# Patient Record
Sex: Female | Born: 1935 | Race: White | Hispanic: No | Marital: Married | Smoking: Never smoker
Health system: Southern US, Community
[De-identification: ages and names within clinical notes are randomized; demographics above are authoritative.]

## PROBLEM LIST (undated history)

## (undated) DIAGNOSIS — E785 Hyperlipidemia, unspecified: Secondary | ICD-10-CM

## (undated) DIAGNOSIS — N390 Urinary tract infection, site not specified: Secondary | ICD-10-CM

## (undated) DIAGNOSIS — R32 Unspecified urinary incontinence: Secondary | ICD-10-CM

## (undated) DIAGNOSIS — F32A Depression, unspecified: Secondary | ICD-10-CM

## (undated) DIAGNOSIS — F329 Major depressive disorder, single episode, unspecified: Secondary | ICD-10-CM

## (undated) DIAGNOSIS — I5022 Chronic systolic (congestive) heart failure: Secondary | ICD-10-CM

## (undated) DIAGNOSIS — I10 Essential (primary) hypertension: Secondary | ICD-10-CM

## (undated) DIAGNOSIS — F03B0 Unspecified dementia, moderate, without behavioral disturbance, psychotic disturbance, mood disturbance, and anxiety: Secondary | ICD-10-CM

## (undated) DIAGNOSIS — I255 Ischemic cardiomyopathy: Secondary | ICD-10-CM

## (undated) DIAGNOSIS — I2510 Atherosclerotic heart disease of native coronary artery without angina pectoris: Secondary | ICD-10-CM

## (undated) DIAGNOSIS — I350 Nonrheumatic aortic (valve) stenosis: Secondary | ICD-10-CM

## (undated) DIAGNOSIS — I471 Supraventricular tachycardia, unspecified: Secondary | ICD-10-CM

## (undated) DIAGNOSIS — Z9289 Personal history of other medical treatment: Secondary | ICD-10-CM

## (undated) DIAGNOSIS — F0390 Unspecified dementia without behavioral disturbance: Secondary | ICD-10-CM

## (undated) DIAGNOSIS — A809 Acute poliomyelitis, unspecified: Secondary | ICD-10-CM

## (undated) DIAGNOSIS — K5641 Fecal impaction: Secondary | ICD-10-CM

## (undated) DIAGNOSIS — R011 Cardiac murmur, unspecified: Secondary | ICD-10-CM

## (undated) DIAGNOSIS — M1990 Unspecified osteoarthritis, unspecified site: Secondary | ICD-10-CM

## (undated) HISTORY — PX: CHOLECYSTECTOMY: SHX55

## (undated) HISTORY — PX: TONSILLECTOMY: SUR1361

## (undated) HISTORY — PX: BUNIONECTOMY: SHX129

## (undated) HISTORY — PX: CATARACT EXTRACTION W/ INTRAOCULAR LENS  IMPLANT, BILATERAL: SHX1307

## (undated) HISTORY — PX: GANGLION CYST EXCISION: SHX1691

## (undated) HISTORY — PX: ANKLE ARTHROPLASTY: SUR68

## (undated) HISTORY — PX: ABDOMINAL HYSTERECTOMY: SHX81

## (undated) HISTORY — PX: CARDIAC CATHETERIZATION: SHX172

## (undated) HISTORY — DX: Ischemic cardiomyopathy: I25.5

## (undated) HISTORY — PX: TOTAL KNEE ARTHROPLASTY: SHX125

## (undated) HISTORY — PX: CARPAL TUNNEL RELEASE: SHX101

## (undated) HISTORY — DX: Chronic systolic (congestive) heart failure: I50.22

## (undated) HISTORY — PX: ATRIAL TACH ABLATION: SHX5734

## (undated) HISTORY — PX: CORONARY ANGIOPLASTY: SHX604

---

## 1996-11-06 DIAGNOSIS — Z9289 Personal history of other medical treatment: Secondary | ICD-10-CM

## 1996-11-06 HISTORY — DX: Personal history of other medical treatment: Z92.89

## 1997-04-02 DIAGNOSIS — Z951 Presence of aortocoronary bypass graft: Secondary | ICD-10-CM

## 1997-04-02 HISTORY — PX: CORONARY ARTERY BYPASS GRAFT: SHX141

## 2004-09-09 ENCOUNTER — Ambulatory Visit: Payer: Self-pay | Admitting: Urology

## 2004-12-23 ENCOUNTER — Ambulatory Visit: Payer: Self-pay | Admitting: Internal Medicine

## 2005-10-03 ENCOUNTER — Ambulatory Visit: Payer: Self-pay | Admitting: Internal Medicine

## 2006-01-03 ENCOUNTER — Ambulatory Visit: Payer: Self-pay | Admitting: Internal Medicine

## 2006-02-28 ENCOUNTER — Encounter: Admission: RE | Admit: 2006-02-28 | Discharge: 2006-02-28 | Payer: Self-pay | Admitting: Neurosurgery

## 2006-04-06 ENCOUNTER — Inpatient Hospital Stay (HOSPITAL_COMMUNITY): Admission: RE | Admit: 2006-04-06 | Discharge: 2006-04-07 | Payer: Self-pay | Admitting: Neurosurgery

## 2006-08-23 ENCOUNTER — Other Ambulatory Visit: Payer: Self-pay

## 2006-08-23 ENCOUNTER — Ambulatory Visit: Payer: Self-pay | Admitting: Unknown Physician Specialty

## 2006-08-27 ENCOUNTER — Ambulatory Visit: Payer: Self-pay | Admitting: Unknown Physician Specialty

## 2007-01-07 ENCOUNTER — Ambulatory Visit: Payer: Self-pay | Admitting: Internal Medicine

## 2007-03-15 ENCOUNTER — Ambulatory Visit: Payer: Self-pay | Admitting: Unknown Physician Specialty

## 2007-03-15 ENCOUNTER — Other Ambulatory Visit: Payer: Self-pay

## 2007-03-25 ENCOUNTER — Inpatient Hospital Stay: Payer: Self-pay | Admitting: Unknown Physician Specialty

## 2007-07-10 ENCOUNTER — Ambulatory Visit: Payer: Self-pay | Admitting: Gastroenterology

## 2008-01-09 ENCOUNTER — Ambulatory Visit: Payer: Self-pay | Admitting: Internal Medicine

## 2009-01-11 ENCOUNTER — Ambulatory Visit: Payer: Self-pay | Admitting: Internal Medicine

## 2009-01-14 ENCOUNTER — Ambulatory Visit: Payer: Self-pay | Admitting: Urology

## 2009-01-19 ENCOUNTER — Ambulatory Visit: Payer: Self-pay | Admitting: Urology

## 2010-01-13 ENCOUNTER — Ambulatory Visit: Payer: Self-pay | Admitting: Internal Medicine

## 2010-01-18 ENCOUNTER — Ambulatory Visit: Payer: Self-pay | Admitting: Internal Medicine

## 2010-07-12 ENCOUNTER — Ambulatory Visit: Payer: Self-pay | Admitting: Gastroenterology

## 2010-07-26 ENCOUNTER — Ambulatory Visit: Payer: Self-pay | Admitting: Surgery

## 2011-01-18 ENCOUNTER — Ambulatory Visit: Payer: Self-pay | Admitting: Internal Medicine

## 2011-09-05 ENCOUNTER — Ambulatory Visit: Payer: Self-pay | Admitting: Ophthalmology

## 2011-09-05 DIAGNOSIS — I119 Hypertensive heart disease without heart failure: Secondary | ICD-10-CM

## 2011-09-12 ENCOUNTER — Ambulatory Visit: Payer: Self-pay | Admitting: Ophthalmology

## 2012-01-22 ENCOUNTER — Ambulatory Visit: Payer: Self-pay | Admitting: Internal Medicine

## 2012-02-07 ENCOUNTER — Emergency Department: Payer: Self-pay

## 2012-06-11 ENCOUNTER — Ambulatory Visit: Payer: Self-pay | Admitting: Internal Medicine

## 2012-11-24 ENCOUNTER — Inpatient Hospital Stay: Payer: Self-pay | Admitting: Internal Medicine

## 2012-11-24 LAB — URINALYSIS, COMPLETE
Bilirubin,UR: NEGATIVE
Glucose,UR: NEGATIVE mg/dL (ref 0–75)
RBC,UR: 6 /HPF (ref 0–5)
Specific Gravity: 1.014 (ref 1.003–1.030)
Squamous Epithelial: NONE SEEN

## 2012-11-24 LAB — COMPREHENSIVE METABOLIC PANEL
Alkaline Phosphatase: 135 U/L (ref 50–136)
Anion Gap: 7 (ref 7–16)
Calcium, Total: 9.6 mg/dL (ref 8.5–10.1)
Chloride: 103 mmol/L (ref 98–107)
Co2: 28 mmol/L (ref 21–32)
EGFR (African American): >60
Glucose: 105 mg/dL — ABNORMAL HIGH (ref 65–99)
Osmolality: 277 (ref 275–301)
Potassium: 3.4 mmol/L — ABNORMAL LOW (ref 3.5–5.1)
Sodium: 138 mmol/L (ref 136–145)
Total Protein: 7.8 g/dL (ref 6.4–8.2)

## 2012-11-24 LAB — TROPONIN I: Troponin-I: 0.03 ng/mL

## 2012-11-24 LAB — CBC
HCT: 43.9 % (ref 35.0–47.0)
HGB: 15.1 g/dL (ref 12.0–16.0)
MCH: 30.1 pg (ref 26.0–34.0)
MCHC: 34.5 g/dL (ref 32.0–36.0)
Platelet: 136 10*3/uL — ABNORMAL LOW (ref 150–440)
RBC: 5.02 10*6/uL (ref 3.80–5.20)

## 2012-11-25 LAB — BASIC METABOLIC PANEL
BUN: 15 mg/dL (ref 7–18)
Calcium, Total: 9.5 mg/dL (ref 8.5–10.1)
Chloride: 102 mmol/L (ref 98–107)
Co2: 28 mmol/L (ref 21–32)
Creatinine: 0.59 mg/dL — ABNORMAL LOW (ref 0.60–1.30)
EGFR (African American): >60
Glucose: 92 mg/dL (ref 65–99)
Osmolality: 274 (ref 275–301)

## 2012-11-25 LAB — CBC WITH DIFFERENTIAL/PLATELET
Basophil #: 0.1 10*3/uL (ref 0.0–0.1)
Basophil %: 0.4 %
HCT: 43.4 % (ref 35.0–47.0)
HGB: 14.5 g/dL (ref 12.0–16.0)
Lymphocyte %: 6.5 %
MCH: 29.5 pg (ref 26.0–34.0)
MCHC: 33.5 g/dL (ref 32.0–36.0)
Monocyte #: 1.2 x10 3/mm — ABNORMAL HIGH (ref 0.2–0.9)
Neutrophil %: 82.9 %
Platelet: 132 10*3/uL — ABNORMAL LOW (ref 150–440)
RBC: 4.92 10*6/uL (ref 3.80–5.20)
RDW: 14 % (ref 11.5–14.5)

## 2012-11-25 LAB — LIPID PANEL
Cholesterol: 143 mg/dL (ref 0–200)
Triglycerides: 84 mg/dL (ref 0–200)
VLDL Cholesterol, Calc: 17 mg/dL (ref 5–40)

## 2012-11-25 LAB — MAGNESIUM: Magnesium: 1.8 mg/dL

## 2012-11-30 LAB — CULTURE, BLOOD (SINGLE)

## 2013-07-01 ENCOUNTER — Emergency Department: Payer: Self-pay | Admitting: Emergency Medicine

## 2013-07-01 LAB — CBC WITH DIFFERENTIAL/PLATELET
Eosinophil %: 0.4 %
HCT: 45.7 % (ref 35.0–47.0)
HGB: 15.8 g/dL (ref 12.0–16.0)
MCHC: 34.7 g/dL (ref 32.0–36.0)
Monocyte #: 0.5 x10 3/mm (ref 0.2–0.9)
Monocyte %: 3.2 %
Neutrophil #: 14.6 10*3/uL — ABNORMAL HIGH (ref 1.4–6.5)
Neutrophil %: 93.3 %
Platelet: 176 10*3/uL (ref 150–440)
RBC: 5.18 10*6/uL (ref 3.80–5.20)
RDW: 13.4 % (ref 11.5–14.5)
WBC: 15.6 10*3/uL — ABNORMAL HIGH (ref 3.6–11.0)

## 2013-07-01 LAB — URINALYSIS, COMPLETE
Bilirubin,UR: NEGATIVE
Blood: NEGATIVE
Glucose,UR: NEGATIVE mg/dL (ref 0–75)
Ketone: NEGATIVE
Leukocyte Esterase: NEGATIVE
Ph: 5 (ref 4.5–8.0)
Protein: 30
RBC,UR: <1 /HPF (ref 0–5)
Specific Gravity: 1.017 (ref 1.003–1.030)
Squamous Epithelial: 1

## 2013-07-01 LAB — COMPREHENSIVE METABOLIC PANEL
Albumin: 3.9 g/dL (ref 3.4–5.0)
Anion Gap: 9 (ref 7–16)
Bilirubin,Total: 1.2 mg/dL — ABNORMAL HIGH (ref 0.2–1.0)
Calcium, Total: 10.1 mg/dL (ref 8.5–10.1)
Chloride: 101 mmol/L (ref 98–107)
Creatinine: 0.7 mg/dL (ref 0.60–1.30)
EGFR (African American): >60
EGFR (Non-African Amer.): >60
Glucose: 108 mg/dL — ABNORMAL HIGH (ref 65–99)
Potassium: 3.4 mmol/L — ABNORMAL LOW (ref 3.5–5.1)
SGOT(AST): 411 U/L — ABNORMAL HIGH (ref 15–37)
SGPT (ALT): 298 U/L — ABNORMAL HIGH (ref 12–78)
Sodium: 136 mmol/L (ref 136–145)

## 2013-09-12 ENCOUNTER — Emergency Department: Payer: Self-pay | Admitting: Emergency Medicine

## 2013-10-09 ENCOUNTER — Ambulatory Visit: Payer: Self-pay | Admitting: Ophthalmology

## 2013-10-20 ENCOUNTER — Ambulatory Visit: Payer: Self-pay | Admitting: Ophthalmology

## 2013-12-07 HISTORY — PX: CARDIAC CATHETERIZATION: SHX172

## 2013-12-24 DIAGNOSIS — K5641 Fecal impaction: Secondary | ICD-10-CM

## 2013-12-24 DIAGNOSIS — N390 Urinary tract infection, site not specified: Secondary | ICD-10-CM

## 2013-12-24 HISTORY — DX: Urinary tract infection, site not specified: N39.0

## 2013-12-24 HISTORY — DX: Fecal impaction: K56.41

## 2013-12-24 LAB — URINALYSIS, COMPLETE
Bilirubin,UR: NEGATIVE
GLUCOSE, UR: NEGATIVE mg/dL (ref 0–75)
Ketone: NEGATIVE
NITRITE: NEGATIVE
PH: 6 (ref 4.5–8.0)
Protein: NEGATIVE
RBC,UR: 2 /HPF (ref 0–5)
SPECIFIC GRAVITY: 1.004 (ref 1.003–1.030)
Squamous Epithelial: 24
WBC UR: 25 /HPF (ref 0–5)

## 2013-12-24 LAB — COMPREHENSIVE METABOLIC PANEL
ALBUMIN: 3.6 g/dL (ref 3.4–5.0)
ALT: 16 U/L (ref 12–78)
AST: 21 U/L (ref 15–37)
Alkaline Phosphatase: 90 U/L
Anion Gap: 6 — ABNORMAL LOW (ref 7–16)
BUN: 17 mg/dL (ref 7–18)
Bilirubin,Total: 1.1 mg/dL — ABNORMAL HIGH (ref 0.2–1.0)
CO2: 29 mmol/L (ref 21–32)
CREATININE: 0.73 mg/dL (ref 0.60–1.30)
Calcium, Total: 10.2 mg/dL — ABNORMAL HIGH (ref 8.5–10.1)
Chloride: 104 mmol/L (ref 98–107)
GLUCOSE: 78 mg/dL (ref 65–99)
Osmolality: 278 (ref 275–301)
Potassium: 3.7 mmol/L (ref 3.5–5.1)
Sodium: 139 mmol/L (ref 136–145)
Total Protein: 7.9 g/dL (ref 6.4–8.2)

## 2013-12-24 LAB — CBC
HCT: 49.1 % — AB (ref 35.0–47.0)
HGB: 15.8 g/dL (ref 12.0–16.0)
MCH: 28.7 pg (ref 26.0–34.0)
MCHC: 32.2 g/dL (ref 32.0–36.0)
MCV: 89 fL (ref 80–100)
Platelet: 185 10*3/uL (ref 150–440)
RBC: 5.51 10*6/uL — ABNORMAL HIGH (ref 3.80–5.20)
RDW: 15.4 % — AB (ref 11.5–14.5)
WBC: 9.4 10*3/uL (ref 3.6–11.0)

## 2013-12-24 LAB — MAGNESIUM: Magnesium: 2.1 mg/dL

## 2013-12-24 LAB — TROPONIN I
Troponin-I: 0.37 ng/mL — ABNORMAL HIGH
Troponin-I: 0.46 ng/mL — ABNORMAL HIGH

## 2013-12-24 LAB — LIPASE, BLOOD: Lipase: 114 U/L (ref 73–393)

## 2013-12-24 LAB — CK-MB
CK-MB: 2.2 ng/mL (ref 0.5–3.6)
CK-MB: 2 ng/mL (ref 0.5–3.6)

## 2013-12-25 ENCOUNTER — Inpatient Hospital Stay: Payer: Self-pay | Admitting: Internal Medicine

## 2013-12-25 DIAGNOSIS — R7989 Other specified abnormal findings of blood chemistry: Secondary | ICD-10-CM

## 2013-12-25 DIAGNOSIS — I2510 Atherosclerotic heart disease of native coronary artery without angina pectoris: Secondary | ICD-10-CM

## 2013-12-25 DIAGNOSIS — I359 Nonrheumatic aortic valve disorder, unspecified: Secondary | ICD-10-CM

## 2013-12-25 LAB — BASIC METABOLIC PANEL
Anion Gap: 8 (ref 7–16)
BUN: 14 mg/dL (ref 7–18)
CHLORIDE: 106 mmol/L (ref 98–107)
CREATININE: 0.77 mg/dL (ref 0.60–1.30)
Calcium, Total: 9.4 mg/dL (ref 8.5–10.1)
Co2: 25 mmol/L (ref 21–32)
Glucose: 112 mg/dL — ABNORMAL HIGH (ref 65–99)
Osmolality: 279 (ref 275–301)
Potassium: 3 mmol/L — ABNORMAL LOW (ref 3.5–5.1)
Sodium: 139 mmol/L (ref 136–145)

## 2013-12-25 LAB — CBC WITH DIFFERENTIAL/PLATELET
BASOS ABS: 0.1 10*3/uL (ref 0.0–0.1)
Basophil %: 0.8 %
EOS PCT: 0.9 %
Eosinophil #: 0.1 10*3/uL (ref 0.0–0.7)
HCT: 43.8 % (ref 35.0–47.0)
HGB: 14.9 g/dL (ref 12.0–16.0)
Lymphocyte #: 0.9 10*3/uL — ABNORMAL LOW (ref 1.0–3.6)
Lymphocyte %: 8.7 %
MCH: 29.9 pg (ref 26.0–34.0)
MCHC: 34 g/dL (ref 32.0–36.0)
MCV: 88 fL (ref 80–100)
MONOS PCT: 9.6 %
Monocyte #: 1 x10 3/mm — ABNORMAL HIGH (ref 0.2–0.9)
NEUTROS PCT: 80 %
Neutrophil #: 8.5 10*3/uL — ABNORMAL HIGH (ref 1.4–6.5)
PLATELETS: 168 10*3/uL (ref 150–440)
RBC: 4.97 10*6/uL (ref 3.80–5.20)
RDW: 14.8 % — ABNORMAL HIGH (ref 11.5–14.5)
WBC: 10.7 10*3/uL (ref 3.6–11.0)

## 2013-12-25 LAB — TROPONIN I: Troponin-I: 0.56 ng/mL — ABNORMAL HIGH

## 2013-12-25 LAB — CK-MB: CK-MB: 2.2 ng/mL (ref 0.5–3.6)

## 2013-12-26 LAB — CBC WITH DIFFERENTIAL/PLATELET
BASOS ABS: 0.1 10*3/uL (ref 0.0–0.1)
Basophil %: 0.7 %
Eosinophil #: 0.3 10*3/uL (ref 0.0–0.7)
Eosinophil %: 3.3 %
HCT: 45.6 % (ref 35.0–47.0)
HGB: 15.2 g/dL (ref 12.0–16.0)
LYMPHS ABS: 0.9 10*3/uL — AB (ref 1.0–3.6)
LYMPHS PCT: 11.5 %
MCH: 29.9 pg (ref 26.0–34.0)
MCHC: 33.4 g/dL (ref 32.0–36.0)
MCV: 90 fL (ref 80–100)
MONO ABS: 0.9 x10 3/mm (ref 0.2–0.9)
Monocyte %: 11.8 %
NEUTROS ABS: 5.7 10*3/uL (ref 1.4–6.5)
NEUTROS PCT: 72.7 %
PLATELETS: 182 10*3/uL (ref 150–440)
RBC: 5.1 10*6/uL (ref 3.80–5.20)
RDW: 14.8 % — ABNORMAL HIGH (ref 11.5–14.5)
WBC: 7.9 10*3/uL (ref 3.6–11.0)

## 2013-12-26 LAB — BASIC METABOLIC PANEL
Anion Gap: 7 (ref 7–16)
BUN: 11 mg/dL (ref 7–18)
CHLORIDE: 106 mmol/L (ref 98–107)
CREATININE: 0.79 mg/dL (ref 0.60–1.30)
Calcium, Total: 9.8 mg/dL (ref 8.5–10.1)
Co2: 27 mmol/L (ref 21–32)
EGFR (African American): >60
Glucose: 110 mg/dL — ABNORMAL HIGH (ref 65–99)
OSMOLALITY: 279 (ref 275–301)
POTASSIUM: 3.1 mmol/L — AB (ref 3.5–5.1)
Sodium: 140 mmol/L (ref 136–145)

## 2013-12-26 LAB — URINE CULTURE

## 2013-12-28 DIAGNOSIS — I5040 Unspecified combined systolic (congestive) and diastolic (congestive) heart failure: Secondary | ICD-10-CM

## 2013-12-28 LAB — TROPONIN I: Troponin-I: 0.3 ng/mL — ABNORMAL HIGH

## 2013-12-28 LAB — MAGNESIUM: Magnesium: 1.6 mg/dL — ABNORMAL LOW

## 2013-12-29 ENCOUNTER — Encounter (HOSPITAL_COMMUNITY): Payer: Self-pay | Admitting: Cardiovascular Disease

## 2013-12-29 ENCOUNTER — Encounter: Payer: Self-pay | Admitting: Cardiovascular Disease

## 2013-12-29 ENCOUNTER — Inpatient Hospital Stay (HOSPITAL_COMMUNITY)
Admission: AD | Admit: 2013-12-29 | Discharge: 2013-12-31 | DRG: 307 | Disposition: A | Discharge status: Home / Self Care | Payer: Medicare Other | Source: Other Acute Inpatient Hospital | Attending: Cardiovascular Disease | Admitting: Cardiovascular Disease

## 2013-12-29 DIAGNOSIS — I428 Other cardiomyopathies: Secondary | ICD-10-CM

## 2013-12-29 DIAGNOSIS — Z8249 Family history of ischemic heart disease and other diseases of the circulatory system: Secondary | ICD-10-CM

## 2013-12-29 DIAGNOSIS — M1990 Unspecified osteoarthritis, unspecified site: Secondary | ICD-10-CM | POA: Diagnosis present

## 2013-12-29 DIAGNOSIS — R269 Unspecified abnormalities of gait and mobility: Secondary | ICD-10-CM | POA: Diagnosis present

## 2013-12-29 DIAGNOSIS — I359 Nonrheumatic aortic valve disorder, unspecified: Principal | ICD-10-CM

## 2013-12-29 DIAGNOSIS — I2510 Atherosclerotic heart disease of native coronary artery without angina pectoris: Secondary | ICD-10-CM

## 2013-12-29 DIAGNOSIS — A499 Bacterial infection, unspecified: Secondary | ICD-10-CM

## 2013-12-29 DIAGNOSIS — E785 Hyperlipidemia, unspecified: Secondary | ICD-10-CM | POA: Diagnosis present

## 2013-12-29 DIAGNOSIS — Z7982 Long term (current) use of aspirin: Secondary | ICD-10-CM

## 2013-12-29 DIAGNOSIS — I10 Essential (primary) hypertension: Secondary | ICD-10-CM | POA: Diagnosis present

## 2013-12-29 DIAGNOSIS — R4181 Age-related cognitive decline: Secondary | ICD-10-CM | POA: Diagnosis present

## 2013-12-29 DIAGNOSIS — Z8612 Personal history of poliomyelitis: Secondary | ICD-10-CM

## 2013-12-29 DIAGNOSIS — Z96659 Presence of unspecified artificial knee joint: Secondary | ICD-10-CM

## 2013-12-29 DIAGNOSIS — Z951 Presence of aortocoronary bypass graft: Secondary | ICD-10-CM

## 2013-12-29 DIAGNOSIS — I350 Nonrheumatic aortic (valve) stenosis: Secondary | ICD-10-CM

## 2013-12-29 DIAGNOSIS — B9689 Other specified bacterial agents as the cause of diseases classified elsewhere: Secondary | ICD-10-CM | POA: Diagnosis present

## 2013-12-29 DIAGNOSIS — Z9861 Coronary angioplasty status: Secondary | ICD-10-CM

## 2013-12-29 DIAGNOSIS — F0390 Unspecified dementia without behavioral disturbance: Secondary | ICD-10-CM

## 2013-12-29 DIAGNOSIS — R32 Unspecified urinary incontinence: Secondary | ICD-10-CM | POA: Diagnosis present

## 2013-12-29 DIAGNOSIS — Z880 Allergy status to penicillin: Secondary | ICD-10-CM

## 2013-12-29 DIAGNOSIS — IMO0002 Reserved for concepts with insufficient information to code with codable children: Secondary | ICD-10-CM

## 2013-12-29 DIAGNOSIS — N390 Urinary tract infection, site not specified: Secondary | ICD-10-CM

## 2013-12-29 DIAGNOSIS — K5641 Fecal impaction: Secondary | ICD-10-CM | POA: Diagnosis present

## 2013-12-29 HISTORY — DX: Hyperlipidemia, unspecified: E78.5

## 2013-12-29 HISTORY — DX: Unspecified osteoarthritis, unspecified site: M19.90

## 2013-12-29 HISTORY — DX: Unspecified urinary incontinence: R32

## 2013-12-29 HISTORY — DX: Acute poliomyelitis, unspecified: A80.9

## 2013-12-29 HISTORY — DX: Nonrheumatic aortic (valve) stenosis: I35.0

## 2013-12-29 HISTORY — DX: Supraventricular tachycardia, unspecified: I47.10

## 2013-12-29 HISTORY — DX: Essential (primary) hypertension: I10

## 2013-12-29 HISTORY — DX: Atherosclerotic heart disease of native coronary artery without angina pectoris: I25.10

## 2013-12-29 HISTORY — DX: Fecal impaction: K56.41

## 2013-12-29 HISTORY — DX: Personal history of other medical treatment: Z92.89

## 2013-12-29 HISTORY — DX: Depression, unspecified: F32.A

## 2013-12-29 HISTORY — DX: Cardiac murmur, unspecified: R01.1

## 2013-12-29 HISTORY — DX: Unspecified dementia, moderate, without behavioral disturbance, psychotic disturbance, mood disturbance, and anxiety: F03.B0

## 2013-12-29 HISTORY — DX: Supraventricular tachycardia: I47.1

## 2013-12-29 HISTORY — DX: Urinary tract infection, site not specified: N39.0

## 2013-12-29 HISTORY — DX: Major depressive disorder, single episode, unspecified: F32.9

## 2013-12-29 HISTORY — DX: Unspecified dementia without behavioral disturbance: F03.90

## 2013-12-29 LAB — COMPREHENSIVE METABOLIC PANEL
ALBUMIN: 3.2 g/dL — AB (ref 3.5–5.2)
ALK PHOS: 71 U/L (ref 39–117)
ALT: 40 U/L — ABNORMAL HIGH (ref 0–35)
AST: 46 U/L — AB (ref 0–37)
BUN: 12 mg/dL (ref 6–23)
CO2: 23 mEq/L (ref 19–32)
Calcium: 9.6 mg/dL (ref 8.4–10.5)
Chloride: 104 mEq/L (ref 96–112)
Creatinine, Ser: 0.6 mg/dL (ref 0.50–1.10)
GFR calc Af Amer: >90 mL/min (ref >90)
GFR calc non Af Amer: 84 mL/min — ABNORMAL LOW (ref >90)
Glucose, Bld: 108 mg/dL — ABNORMAL HIGH (ref 70–99)
POTASSIUM: 3.8 meq/L (ref 3.7–5.3)
Sodium: 141 mEq/L (ref 137–147)
Total Bilirubin: 0.7 mg/dL (ref 0.3–1.2)
Total Protein: 6.6 g/dL (ref 6.0–8.3)

## 2013-12-29 LAB — CBC WITH DIFFERENTIAL/PLATELET
BASOS ABS: 0 10*3/uL (ref 0.0–0.1)
BASOS PCT: 0 % (ref 0–1)
EOS PCT: 4 % (ref 0–5)
Eosinophils Absolute: 0.4 10*3/uL (ref 0.0–0.7)
HCT: 42.4 % (ref 36.0–46.0)
Hemoglobin: 14.5 g/dL (ref 12.0–15.0)
Lymphocytes Relative: 12 % (ref 12–46)
Lymphs Abs: 1.1 10*3/uL (ref 0.7–4.0)
MCH: 30.4 pg (ref 26.0–34.0)
MCHC: 34.2 g/dL (ref 30.0–36.0)
MCV: 88.9 fL (ref 78.0–100.0)
MONO ABS: 1 10*3/uL (ref 0.1–1.0)
Monocytes Relative: 10 % (ref 3–12)
NEUTROS ABS: 7 10*3/uL (ref 1.7–7.7)
Neutrophils Relative %: 74 % (ref 43–77)
Platelets: 245 10*3/uL (ref 150–400)
RBC: 4.77 MIL/uL (ref 3.87–5.11)
RDW: 14.4 % (ref 11.5–15.5)
WBC: 9.5 10*3/uL (ref 4.0–10.5)

## 2013-12-29 LAB — BASIC METABOLIC PANEL
Anion Gap: 4 — ABNORMAL LOW (ref 7–16)
BUN: 13 mg/dL (ref 7–18)
CALCIUM: 9.5 mg/dL (ref 8.5–10.1)
Chloride: 107 mmol/L (ref 98–107)
Co2: 29 mmol/L (ref 21–32)
Creatinine: 0.77 mg/dL (ref 0.60–1.30)
GLUCOSE: 103 mg/dL — AB (ref 65–99)
Osmolality: 280 (ref 275–301)
POTASSIUM: 4 mmol/L (ref 3.5–5.1)
SODIUM: 140 mmol/L (ref 136–145)

## 2013-12-29 MED ORDER — METOPROLOL SUCCINATE ER 100 MG PO TB24
100.0000 mg | ORAL_TABLET | Freq: Every day | ORAL | Status: DC
  Administered 2013-12-31: 100 mg via ORAL
  Filled 2013-12-29 (×3): qty 1

## 2013-12-29 MED ORDER — ASPIRIN EC 81 MG PO TBEC
81.0000 mg | DELAYED_RELEASE_TABLET | Freq: Every day | ORAL | Status: DC
  Administered 2013-12-30 – 2013-12-31 (×2): 81 mg via ORAL
  Filled 2013-12-29 (×2): qty 1

## 2013-12-29 MED ORDER — NITROGLYCERIN 0.4 MG SL SUBL: 0.4000 mg | SUBLINGUAL_TABLET | SUBLINGUAL | Status: DC | PRN

## 2013-12-29 MED ORDER — RIVASTIGMINE 4.6 MG/24HR TD PT24
4.6000 mg | MEDICATED_PATCH | Freq: Every day | TRANSDERMAL | Status: DC
  Administered 2013-12-30 – 2013-12-31 (×2): 4.6 mg via TRANSDERMAL
  Filled 2013-12-29 (×2): qty 1

## 2013-12-29 MED ORDER — FESOTERODINE FUMARATE ER 8 MG PO TB24
8.0000 mg | ORAL_TABLET | Freq: Every day | ORAL | Status: DC
  Administered 2013-12-30: 8 mg via ORAL
  Filled 2013-12-29 (×4): qty 1

## 2013-12-29 MED ORDER — CITALOPRAM HYDROBROMIDE 20 MG PO TABS
20.0000 mg | ORAL_TABLET | Freq: Every day | ORAL | Status: DC
  Administered 2013-12-30 – 2013-12-31 (×2): 20 mg via ORAL
  Filled 2013-12-29 (×4): qty 1

## 2013-12-29 MED ORDER — AMLODIPINE BESYLATE 5 MG PO TABS
5.0000 mg | ORAL_TABLET | Freq: Every day | ORAL | Status: DC
  Administered 2013-12-30 – 2013-12-31 (×2): 5 mg via ORAL
  Filled 2013-12-29 (×3): qty 1

## 2013-12-29 MED ORDER — CELECOXIB 200 MG PO CAPS
200.0000 mg | ORAL_CAPSULE | Freq: Every day | ORAL | Status: DC
  Administered 2013-12-30 – 2013-12-31 (×2): 200 mg via ORAL
  Filled 2013-12-29 (×3): qty 1

## 2013-12-29 MED ORDER — LOSARTAN POTASSIUM 50 MG PO TABS
50.0000 mg | ORAL_TABLET | Freq: Every day | ORAL | Status: DC
  Administered 2013-12-30 – 2013-12-31 (×2): 50 mg via ORAL
  Filled 2013-12-29 (×3): qty 1

## 2013-12-29 NOTE — H&P (Signed)
Patient ID: [name] MRN: [id number] DOB/AGE: 1931-01-17 80 y.o. Admit date: 12/29/2013  Primary Cardiologist: Dossie Arbour  HPI: 80 yo female with history of dementia, CAD s/p CABG in 1995, SVT s/p RFA, aortic valve stenosis, HTN, HLD transferred to [facility] today from [facility] for further evaluation of her aortic valve stenosis. She was admitted to [facility] 12/24/13 with LLQ abdominal pain, intermittent chest pain. Her troponin was elevated, peak of 0.56. Echo had shown normal LV systolic function in January 2014 with severe AS at that time. She has done well over the last year. She underwent cardiac cath today per Dr. Kirke Corin at [facility] with patent left main, 60% diffuse proximal LAD stenosis, 100% mid LAD stenosis with patent LIMA to mid LAD, 60% diagonal stenosis, 90% mid Circumflex stenosis, 100% distal Circumflex stenosis, 80% OM2 stenosis with patent vein graft, 100% proximal RCA occlusion with patent sequential vein graft to the PDA. Repeat echo with LVEF=40% with critical aortic valve stenosis, mean gradient across aortic valve of 60 mmHg.   She is resting comfortably in the bed tonight. Most of the history is given by her husband. She answers yes and no to questions but frequently becomes agitated during the interview and says she does not want to be put through this. She denies any current chest pain or SOB. Her husband states that her dementia seems to be worsening recently. She is often not aware of where she is. 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 She has gain instability and walks slowly around the house unassisted.   Review of systems complete and found to be negative unless listed above   Past Medical History  Diagnosis Date  . Aortic valve stenosis   . CAD (coronary artery disease)     s/p CABG 1995  . HTN (hypertension)   . Hyperlipidemia   . SVT (supraventricular tachycardia)     s/p  ablation [facility]   . DJD (degenerative joint disease)   . Polio   . Urinary incontinence   . Dementia     Family History  Problem Relation Age of Onset  . Heart attack Father   . Heart attack Brother   . Heart attack Mother     History   Social History  . Marital Status: Married    Spouse Name: N/A    Number of Children: N/A  . Years of Education: N/A   Occupational History  . Retired    Social History Main Topics  . Smoking status: Never Smoker   . Smokeless tobacco: Not on file  . Alcohol Use: No  . Drug Use: No  . Sexual Activity: Not on file   Other Topics Concern  . Not on file   Social History Narrative  . No narrative on file    Past Surgical History  Procedure Laterality Date  . Abdominal hysterectomy    . Bunionectomy    . Ganglion cyst excision    . Cholecystectomy      Allergies  Allergen Reactions  . Penicillins Other (See Comments)    Unknown    Prior to Admission Meds:  Prescriptions prior to admission  Medication Sig Dispense Refill  . amLODipine (NORVASC) 5 MG tablet Take 5 mg by mouth daily.      Marland Kitchen aspirin 81 MG tablet Take 81 mg by mouth daily.      . Calcium Citrate-Vitamin  D (CITRACAL + D PO) Take 1 tablet by mouth daily.      . celecoxib (CELEBREX) 200 MG capsule Take 200 mg by mouth daily.      . citalopram (CELEXA) 20 MG tablet Take 20 mg by mouth daily.      . fesoterodine (TOVIAZ) 8 MG TB24 tablet Take 8 mg by mouth daily.      Marland Kitchen. losartan (COZAAR) 50 MG tablet Take 50 mg by mouth daily.      . metoprolol succinate (TOPROL-XL) 100 MG 24 hr tablet Take 100 mg by mouth daily. Take with or immediately following a meal.      . Multiple Vitamins-Minerals (CENTRUM SILVER PO) Take 1 tablet by mouth daily.      . Rivastigmine (EXELON TD) Place 8 mg onto the skin daily.        Physical Exam: Blood pressure 141/75, pulse 77, temperature 97.6 F (36.4 C), temperature source Oral, resp. rate 16, height 5\' 1"  (1.549 m), weight 105 lb 9.6 oz  (47.9 kg), SpO2 99.00%.    General: Thin elderly female, in NAD. Pleasant but only oriented to person. She cannot state where she is tonight, what time of the year it is or who is the current president of the [employer].   HEENT: OP clear, mucus membranes moist  SKIN: warm, dry. No rashes.  Neuro: No focal deficits  Musculoskeletal: Muscle strength 5/5 all ext  Psychiatric: Easily agitated Neck: No JVD, no carotid bruits, no thyromegaly, no lymphadenopathy.  Lungs:Clear bilaterally, no wheezes, rhonci, crackles  Cardiovascular: Regular rate and rhythm. Harsh systolic murmur. No gallops or rubs.  Abdomen:Soft. Bowel sounds present. Non-tender.  Extremities: No lower extremity edema. Pulses are 2 + in the bilateral DP/PT.  Labs: [facility] creat 0.77, K 4.0,   ASSESSMENT AND PLAN:   1. Severe aortic valve stenosis: She has severe aortic valve stenosis with mean gradient of 60 mm Hg. She has had minimal symptoms to be contributed to her AS until recently. She is now transferred to [facility] for consideration for traditional AVR vs transcatheter AVR. She has a poor functional status at baseline per family with at least moderate dementia. Her activities of daily living include walking around the house but she no longer performs any simple tasks such as laundry, cooking or cleaning. Per her family, her dementia seems to be progressing. She has had prior open surgery in 1995 when she underwent CABG. She would likely not be a favorable candidate for redo open surgery. She will be considered for TAVR. I will review the case tomorrow with Dr. Cornelius Moraswen with CT surgery who is also on the TAVR team. The limitation to considering TAVR would be advanced dementia, overall poor functional status and frail state. Will continue home medications tonight and review echo and [facility]. Further plans to follow.   2. Non-ischemic cardiomyopathy: New LV systolic dysfunction with LVEF=40% by echo last seven days.  Continue medical management  3. CAD s/p CABG: Stable CAD by cath today. 4 patent grafts. Continue medical management  4. SVT: s/p catheter ablation. No recent recurrence.   Earney Hamburghris McAlhany, MD 12/29/2013, 8:16 PM

## 2013-12-29 NOTE — Progress Notes (Signed)
Pt. Refused all medications. RN attempted to educate pt on the importance of her medication but she continued to refuse.   Genevive Bi, RN

## 2013-12-30 ENCOUNTER — Inpatient Hospital Stay (HOSPITAL_COMMUNITY): Payer: Medicare Other

## 2013-12-30 ENCOUNTER — Encounter (HOSPITAL_COMMUNITY): Payer: Self-pay | Admitting: Thoracic Surgery (Cardiothoracic Vascular Surgery)

## 2013-12-30 ENCOUNTER — Other Ambulatory Visit: Payer: Self-pay | Admitting: *Deleted

## 2013-12-30 DIAGNOSIS — I059 Rheumatic mitral valve disease, unspecified: Secondary | ICD-10-CM

## 2013-12-30 DIAGNOSIS — M1990 Unspecified osteoarthritis, unspecified site: Secondary | ICD-10-CM | POA: Diagnosis present

## 2013-12-30 DIAGNOSIS — F0390 Unspecified dementia without behavioral disturbance: Secondary | ICD-10-CM

## 2013-12-30 DIAGNOSIS — I359 Nonrheumatic aortic valve disorder, unspecified: Secondary | ICD-10-CM

## 2013-12-30 DIAGNOSIS — R079 Chest pain, unspecified: Secondary | ICD-10-CM

## 2013-12-30 LAB — BASIC METABOLIC PANEL
BUN: 12 mg/dL (ref 6–23)
CO2: 23 mEq/L (ref 19–32)
Calcium: 9.8 mg/dL (ref 8.4–10.5)
Chloride: 103 mEq/L (ref 96–112)
Creatinine, Ser: 0.63 mg/dL (ref 0.50–1.10)
GFR, EST NON AFRICAN AMERICAN: 82 mL/min — AB (ref >90)
Glucose, Bld: 94 mg/dL (ref 70–99)
POTASSIUM: 3.7 meq/L (ref 3.7–5.3)
SODIUM: 140 meq/L (ref 137–147)

## 2013-12-30 LAB — PULMONARY FUNCTION TEST
FEF 25-75 POST: 2.74 L/s
FEF 25-75 PRE: 2.35 L/s
FEF2575-%Change-Post: 16 %
FEF2575-%PRED-POST: 234 %
FEF2575-%Pred-Pre: 200 %
FEV1-%Change-Post: 2 %
FEV1-%Pred-Post: 97 %
FEV1-%Pred-Pre: 94 %
FEV1-POST: 1.52 L
FEV1-PRE: 1.48 L
FEV1FVC-%Change-Post: -1 %
FEV1FVC-%PRED-PRE: 129 %
FEV6-%Change-Post: 3 %
FEV6-%Pred-Post: 80 %
FEV6-%Pred-Pre: 77 %
FEV6-POST: 1.6 L
FEV6-Pre: 1.54 L
FEV6FVC-%Pred-Post: 106 %
FEV6FVC-%Pred-Pre: 106 %
FVC-%CHANGE-POST: 3 %
FVC-%PRED-PRE: 72 %
FVC-%Pred-Post: 75 %
FVC-Post: 1.6 L
FVC-Pre: 1.54 L
PRE FEV1/FVC RATIO: 96 %
PRE FEV6/FVC RATIO: 100 %
Post FEV1/FVC ratio: 95 %
Post FEV6/FVC ratio: 100 %
RV % pred: 141 %
RV: 3.1 L
TLC % pred: 101 %
TLC: 4.5 L

## 2013-12-30 LAB — CBC
HCT: 44.3 % (ref 36.0–46.0)
Hemoglobin: 14.3 g/dL (ref 12.0–15.0)
MCH: 28.9 pg (ref 26.0–34.0)
MCHC: 32.3 g/dL (ref 30.0–36.0)
MCV: 89.5 fL (ref 78.0–100.0)
Platelets: 254 10*3/uL (ref 150–400)
RBC: 4.95 MIL/uL (ref 3.87–5.11)
RDW: 14.5 % (ref 11.5–15.5)
WBC: 9.3 10*3/uL (ref 4.0–10.5)

## 2013-12-30 MED ORDER — ALBUTEROL SULFATE (2.5 MG/3ML) 0.083% IN NEBU
2.5000 mg | INHALATION_SOLUTION | Freq: Once | RESPIRATORY_TRACT | Status: AC
  Administered 2013-12-30: 2.5 mg via RESPIRATORY_TRACT

## 2013-12-30 MED ORDER — IOHEXOL 350 MG/ML SOLN
100.0000 mL | Freq: Once | INTRAVENOUS | Status: AC | PRN
  Administered 2013-12-30: 100 mL via INTRAVENOUS

## 2013-12-30 MED ORDER — CIPROFLOXACIN HCL 250 MG PO TABS
250.0000 mg | ORAL_TABLET | Freq: Two times a day (BID) | ORAL | Status: DC
  Administered 2013-12-30 – 2013-12-31 (×2): 250 mg via ORAL
  Filled 2013-12-30 (×4): qty 1

## 2013-12-30 NOTE — Progress Notes (Signed)
     SUBJECTIVE: No complaints. Denies chest pain, SOB.   BP 135/71  Pulse 75  Temp(Src) 97.5 F (36.4 C) (Oral)  Resp 18  Ht 5\' 1"  (1.549 m)  Wt 103 lb 4.8 oz (46.857 kg)  BMI 19.53 kg/m2  SpO2 93%  Intake/Output Summary (Last 24 hours) at 12/30/13 1416 Last data filed at 12/30/13 9628  Gross per 24 hour  Intake    240 ml  Output    300 ml  Net    -60 ml    PHYSICAL EXAM General: Well developed, well nourished, in no acute distress. Alert, only oriented to person but not place or time.   Psych:  Good affect, responds appropriately Neck: No JVD. No masses noted.  Lungs: Clear bilaterally with no wheezes or rhonci noted.  Heart: RRR with loud hard systolic murmur.  Abdomen: Bowel sounds are present. Soft, non-tender.  Extremities: No lower extremity edema.   LABS: Basic Metabolic Panel:  Recent Labs  36/62/94 2140 12/30/13 0301  NA 141 140  K 3.8 3.7  CL 104 103  CO2 23 23  GLUCOSE 108* 94  BUN 12 12  CREATININE 0.60 0.63  CALCIUM 9.6 9.8   CBC:  Recent Labs  12/29/13 2140 12/30/13 0301  WBC 9.5 9.3  NEUTROABS 7.0  --   HGB 14.5 14.3  HCT 42.4 44.3  MCV 88.9 89.5  PLT 245 254   Current Meds: . amLODipine  5 mg Oral Daily  . aspirin EC  81 mg Oral Daily  . celecoxib  200 mg Oral Daily  . citalopram  20 mg Oral Daily  . fesoterodine  8 mg Oral QHS  . losartan  50 mg Oral Daily  . metoprolol succinate  100 mg Oral QAC breakfast  . rivastigmine  4.6 mg Transdermal Daily    ASSESSMENT AND PLAN:  1. Severe aortic valve stenosis: She has severe aortic valve stenosis with mean gradient of 60 mm Hg. She has had minimal symptoms to be contributed to her AS. She is now transferred to [facility] for consideration for traditional AVR vs transcatheter AVR. She has a poor functional status at baseline per family with at least moderate dementia. Her activities of daily living include walking around the house with a walker but she no longer performs any  simple tasks such as laundry, cooking or cleaning. Per her family, her dementia seems to be progressing. She has had prior open surgery in 1995 when she underwent CABG. She would not be a favorable candidate for redo open surgery. She will be considered for TAVR. I have discussed her case with Dr. Cornelius Moras who has also done an extensive evaluation and at this time, she is overall asymptomatic from her AS.  -We will plan CTA chest/abd/pelvis today and cardiac CT tomorrow.  -She will be followed after discharge in the Multidisciplinary valve clinic.  The limitation to considering TAVR would be advanced dementia, overall poor functional status and frail state.  -Will continue home medications.   2. Non-ischemic cardiomyopathy: New LV systolic dysfunction with LVEF=40% by echo at [facility]. Continue medical management   3. CAD s/p CABG: Stable CAD by cath 12/29/13 at [facility] 4 patent grafts. Continue medical management   4. SVT: s/p catheter ablation. No recent recurrence.     [name]  2/24/20152:16 PM

## 2013-12-30 NOTE — Progress Notes (Signed)
Utilization Review Completed.Alajia Schmelzer T2/24/2015  

## 2013-12-30 NOTE — Consult Note (Signed)
[address]       [place] [address]             [phone]          CARDIOTHORACIC SURGERY CONSULTATION REPORT  PCP is BABAOFF, Lavada Mesi, MD Referring Provider is Lorine Bears, MD Primary Cardiologist is Julien Nordmann, MD   Reason for consultation:  Severe aortic stenosis  HPI:  Patient is an 80 year old female from [place] with history of dementia, coronary artery disease status post coronary artery bypass grafting in the distant past, aortic stenosis, hypertension, hyperlipidemia, and degenerative arthritis referred for surgical evaluation for severe aortic stenosis. The patient's cardiac history dates back more than 15 years ago when she presented with symptomatic coronary artery disease. She initially was treated with percutaneous coronary angioplasty, but ultimately she underwent coronary artery bypass grafting at [facility] in the late 1990s. Details regarding her previous surgery are not currently available.  She reportedly recovered from her bypass surgery uneventfully although she later developed recurrent supraventricular tachycardia for which she ultimately underwent ablation at [facility] in 2002. She has not had any further signs of ischemic heart disease or congestive heart failure since that time. She developed a heart murmur on physical exam and was found to have aortic stenosis which has been followed intermittently with serial echocardiograms. She has never had any symptoms of exertional shortness of breath or other signs of congestive heart failure.  The patient was hospitalized acutely at [facility] on 12/24/2013 with acute abdominal pain. She was found to have urinary tract infection and fecal impaction. Her fecal impaction cleared easily after she was given oral contrast for CT scan of the abdomen and pelvis and her abdominal pain resolved.  Blood work obtained at the time of her presentation was notable for  mildly elevated troponin levels which prompted cardiac evaluation. Transthoracic echocardiogram performed 12/25/2013 revealed severe aortic stenosis with mild to moderate left ventricular dysfunction. Left and right heart catheterization was performed 12/29/2013. This demonstrated the presence of severe three-vessel coronary artery disease but the patient had widely patent grafts from her previous bypass surgery.  Pulmonary artery pressures were mildly elevated.  Transvalvular gradient across the aortic valve was not assessed at catheterization.  The patient was transferred to [facility] for further diagnostic workup.  The patient lives with her husband in [place]. She suffers from dementia, and the majority of her history and review of systems is provided by her husband.  Reportedly her problems with memory loss and confusion had been of mild severity and gradual onset until approximately 1 year ago when symptoms began to progress more rapidly.  She now has considerable problems with immediate and short-term memory loss and confusion. This dramatically affect her functional status which has become very limited. She is able to get around the house reasonably well although she uses a walker for the most part because of instability of gait. She gets confused at times even just finding her way to the bathroom. She has never reported any problems with shortness of breath either with activity or at rest. She has had occasional brief episodes of mild pain across her chest, but according to her husband these symptoms have been transient and off and on for many years. There is no history PND, orthopnea, or lower extremity edema. There is no history of dizziness or syncope. At present she denies any pain anywhere in her body and she specifically denies any history of  shortness of breath. She also makes it quite clear that she is not interested in any type of invasive medical procedure. She reports that she is at peace  with the fact that she may not live much longer.  Past Medical History  Diagnosis Date  . Aortic valve stenosis   . CAD (coronary artery disease)     s/p CABG 1995  . HTN (hypertension)   . Hyperlipidemia   . SVT (supraventricular tachycardia)     s/p ablation [facility]   . DJD (degenerative joint disease)   . Polio     childhood  . Urinary incontinence   . Dementia   . Fecal impaction 12/24/2013  . UTI (urinary tract infection) 12/24/2013    >100,000 col/mL Hafnia alvei  . Osteoarthritis     Past Surgical History  Procedure Laterality Date  . Abdominal hysterectomy    . Bunionectomy    . Ganglion cyst excision    . Cholecystectomy    . Coronary artery bypass graft  1998    [facility]  . Total knee arthroplasty Left   . Total knee arthroplasty Right   . Atrial tach ablation      [facility]  . Ankle arthroplasty Right     Family History  Problem Relation Age of Onset  . Heart attack Father   . Heart attack Brother   . Heart attack Mother     History   Social History  . Marital Status: Married    Spouse Name: N/A    Number of Children: N/A  . Years of Education: N/A   Occupational History  . Retired    Social History Main Topics  . Smoking status: Never Smoker   . Smokeless tobacco: Not on file  . Alcohol Use: No  . Drug Use: No  . Sexual Activity: Not on file   Other Topics Concern  . Not on file   Social History Narrative  . No narrative on file    Prior to Admission medications   Medication Sig Start Date End Date Taking? Authorizing Provider  amLODipine (NORVASC) 5 MG tablet Take 5 mg by mouth daily.   Yes Historical Provider, MD  aspirin 81 MG tablet Take 81 mg by mouth daily.   Yes Historical Provider, MD  Calcium Citrate-Vitamin D (CITRACAL + D PO) Take 1 tablet by mouth daily.   Yes Historical Provider, MD  celecoxib (CELEBREX) 200 MG capsule Take 200 mg by mouth daily.   Yes Historical Provider, MD  citalopram (CELEXA) 20 MG tablet Take 20 mg by mouth  daily.   Yes Historical Provider, MD  fesoterodine (TOVIAZ) 8 MG TB24 tablet Take 8 mg by mouth daily.   Yes Historical Provider, MD  losartan (COZAAR) 50 MG tablet Take 50 mg by mouth daily.   Yes Historical Provider, MD  metoprolol succinate (TOPROL-XL) 100 MG 24 hr tablet Take 100 mg by mouth daily. Take with or immediately following a meal.   Yes Historical Provider, MD  Multiple Vitamins-Minerals (CENTRUM SILVER PO) Take 1 tablet by mouth daily.   Yes Historical Provider, MD  Rivastigmine (EXELON TD) Place 8 mg onto the skin daily.   Yes Historical Provider, MD    Current Facility-Administered Medications  Medication Dose Route Frequency Provider Last Rate Last Dose  . amLODipine (NORVASC) tablet 5 mg  5 mg Oral Daily Kathleene Hazel, MD   5 mg at 12/30/13 1101  . aspirin EC tablet 81 mg  81 mg Oral Daily Nile Dear  McAlhany, MD   81 mg at 12/30/13 1101  . celecoxib (CELEBREX) capsule 200 mg  200 mg Oral Daily Kathleene Hazel, MD   200 mg at 12/30/13 1100  . citalopram (CELEXA) tablet 20 mg  20 mg Oral Daily Kathleene Hazel, MD   20 mg at 12/30/13 1100  . fesoterodine (TOVIAZ) tablet 8 mg  8 mg Oral QHS Kathleene Hazel, MD      . losartan (COZAAR) tablet 50 mg  50 mg Oral Daily Kathleene Hazel, MD   50 mg at 12/30/13 1100  . metoprolol succinate (TOPROL-XL) 24 hr tablet 100 mg  100 mg Oral QAC breakfast Kathleene Hazel, MD      . nitroGLYCERIN (NITROSTAT) SL tablet 0.4 mg  0.4 mg Sublingual Q5 Min x 3 PRN Kathleene Hazel, MD      . rivastigmine (EXELON) 4.6 mg/24hr 4.6 mg  4.6 mg Transdermal Daily Kathleene Hazel, MD   4.6 mg at 12/30/13 1107    Allergies  Allergen Reactions  . Penicillins Other (See Comments)    Unknown      Review of Systems:   General:  stable appetite, decreased energy, no weight gain, no weight loss, no fever  Cardiac:  no chest pain with exertion, no chest pain at rest, no SOB with exertion, no  resting SOB, no PND, no orthopnea, no palpitations, no arrhythmia, no atrial fibrillation, no LE edema, no dizzy spells, no syncope  Respiratory:  no shortness of breath, no home oxygen, no productive cough, no dry cough, no bronchitis, no wheezing, no hemoptysis, no asthma, no pain with inspiration or cough, no sleep apnea, no CPAP at night  GI:   no difficulty swallowing, no reflux, no frequent heartburn, no hiatal hernia, + abdominal pain, + constipation, no diarrhea, no hematochezia, no hematemesis, no melena  GU:   no dysuria,  no frequency, + recurrent urinary tract infection, no hematuria, no kidney stones, no kidney disease  Vascular:  no pain suggestive of claudication, no pain in feet, no leg cramps, no varicose veins, no DVT, no non-healing foot ulcer  Neuro:   no stroke, no TIA's, no seizures, no headaches, no temporary blindness one eye,  no slurred speech, no peripheral neuropathy, no chronic pain, + mild instability of gait, + severe memory/cognitive dysfunction  Musculoskeletal: + arthritis, no joint swelling, no myalgias, + difficulty walking, decreased mobility   Skin:   no rash, no itching, no skin infections, no pressure sores or ulcerations  Psych:   no anxiety, no depression, no nervousness, no unusual recent stress  Eyes:   no blurry vision, no floaters, no recent vision changes, + wears glasses or contacts  ENT:   no hearing loss, no loose or painful teeth, + dentures, last saw dentist   Hematologic:  no easy bruising, no abnormal bleeding, no clotting disorder, no frequent epistaxis  Endocrine:  no diabetes, does not check CBG's at home     Physical Exam:   BP 124/72  Pulse 77  Temp(Src) 97.9 F (36.6 C) (Oral)  Resp 18  Ht 5\' 1"  (1.549 m)  Wt 46.857 kg (103 lb 4.8 oz)  BMI 19.53 kg/m2  SpO2 94%  General:  Elderly and frail-appearing  HEENT:  Unremarkable   Neck:   no JVD, no bruits, no adenopathy   Chest:   clear to auscultation, symmetrical breath sounds, no  wheezes, no rhonchi   CV:   RRR, grade IV/VI crescendo/decrescendo systolic murmur  Abdomen:  soft, non-tender, no masses   Extremities:  warm, well-perfused, pulses not palpable, no lower extremity edema  Rectal/GU  Deferred  Neuro:   Grossly non-focal and symmetrical throughout  Skin:   Clean and dry, no rashes, no breakdown  Diagnostic Tests:  TRANSTHORACIC ECHOCARDIOGRAM  Both the images and the official report from transthoracic echocardiogram performed 12/25/2013 are reviewed.  There is severe calcific aortic stenosis. Aortic valve appears tricuspid and all 3 leaflets are heavily calcified with restricted leaflet mobility. Peak velocity across the aortic valve ranged between 4.25 and 4.63 m/s. The mean transvalvular gradient was estimated between 44 and 59 mm mercury respectively. There was mild to moderate aortic insufficiency, mild to moderate mitral regurgitation, and mild to moderate global left ventricular systolic dysfunction with ejection fraction estimated 40-45%. There was moderate tricuspid regurgitation. Imaging windows were suboptimal.   CARDIAC CATHETERIZATION  Both the images in the official report from diagnostic cardiac catheterization performed 12/29/2013 are reviewed. There is severe native three-vessel coronary artery disease. There is 100% chronic occlusion of the mid left anterior descending coronary artery after the second diagonal branch. There is a patent left internal mammary artery graft which perfuses the distal left anterior descending coronary artery and is free of significant disease. There is 80-90% stenosis of the mid left circumflex coronary artery. There is 100% proximal occlusion of the right coronary artery. There is a patent vein graft sewn to the right coronary system that appears to be sewn sequentially to the posterior descending coronary artery and the right posterolateral branch. There is no significant disease in this vein graft or the distal  branches.  There is another vein graft which is sewn proximally to the hood of the vein graft to right corner system that appears to perfuse a third obtuse marginal branch of left circumflex corner system. This vein graft has some disease but nothing flow limiting. Terminal branches of the second and third obtuse marginal branch the left circumflex system are diffusely diseased.   STS Risk Calculator  Procedure    AVR + redo CABG  Risk of Mortality   10.8% Morbidity or Mortality  36.9% Prolonged LOS   13.7% Short LOS    12.5% Permanent Stroke   4.0% Prolonged Vent Support  27.4% DSW Infection    0.3% Renal Failure    7.5% Reoperation    13.6%   Impression:  The patient has severe aortic stenosis with mild to moderate left ventricular dysfunction and severe three-vessel coronary artery disease with patent bypass grafts from previous coronary artery bypass surgery in the distant past. The patient's functional status is severely limited because of underlying dementia which has progressed substantially over the past year according to the patient's husband. There is no history of congestive heart failure and both the patient and her husband state that she never has problems with exertional shortness of breath, although her activity level is quite limited. The patient appears extremely frail, and at this point it seems clear her biggest problem relates to her progressive dementia which is at least moderate if not severe.  I would not consider this patient a candidate for conventional surgical aortic valve replacement under any circumstances. Transcatheter aortic valve replacement could be considered as an alternative to extremely high risk conventional surgery, although at this point the patient has not exhibited significant signs or symptoms of congestive heart failure. Her current presentation occurred in the context of urinary tract infection with fecal impaction, and at the time of her original  presentation she was noted to have mildly abnormal troponin levels without signs of congestive heart failure. Perhaps most importantly, the patient clearly expresses a desire to avoid any type of interventional medical procedure despite the fact that she remains confused.   Plan:  I spent in excess of 60 minutes on the telephone with the patient's husband discussing her current condition and treatment options.  He was counseled at length regarding treatment alternatives for management of severe symptomatic aortic stenosis. Alternative approaches such as conventional aortic valve replacement, transcatheter aortic valve replacement, and palliative medical therapy were compared and contrasted at length.   Long-term prognosis with medical therapy was discussed. This discussion was placed in the context of the patient's own specific clinical presentation and past medical history.  All of his questions have been addressed.  At this point we favor continued close followup in the multidisciplinary heart valve clinic. If the patient were to develop significant signs of clinical deterioration related to her severe aortic stenosis then transcatheter aortic valve replacement could be considered in the future.   I spent in excess of 110 minutes during the conduct of this hospital consultation and >50% of this time involved direct face-to-face encounter for counseling and/or coordination of the patient's care.  Salvatore Decentlarence H. Cornelius Moraswen, MD 12/30/2013 12:49 PM

## 2013-12-31 ENCOUNTER — Encounter (HOSPITAL_COMMUNITY): Payer: Self-pay | Admitting: Physician Assistant

## 2013-12-31 ENCOUNTER — Inpatient Hospital Stay (HOSPITAL_COMMUNITY): Payer: Medicare Other

## 2013-12-31 DIAGNOSIS — N390 Urinary tract infection, site not specified: Secondary | ICD-10-CM

## 2013-12-31 DIAGNOSIS — I359 Nonrheumatic aortic valve disorder, unspecified: Secondary | ICD-10-CM

## 2013-12-31 LAB — HEMOGLOBIN A1C
Hgb A1c MFr Bld: 5.4 % (ref <5.7)
MEAN PLASMA GLUCOSE: 108 mg/dL (ref <117)

## 2013-12-31 LAB — TSH: TSH: 2.273 u[IU]/mL (ref 0.350–4.500)

## 2013-12-31 LAB — BASIC METABOLIC PANEL
BUN: 15 mg/dL (ref 6–23)
CO2: 27 mEq/L (ref 19–32)
Calcium: 9.8 mg/dL (ref 8.4–10.5)
Chloride: 104 mEq/L (ref 96–112)
Creatinine, Ser: 0.68 mg/dL (ref 0.50–1.10)
GFR calc non Af Amer: 80 mL/min — ABNORMAL LOW (ref >90)
Glucose, Bld: 87 mg/dL (ref 70–99)
POTASSIUM: 3.8 meq/L (ref 3.7–5.3)
SODIUM: 144 meq/L (ref 137–147)

## 2013-12-31 LAB — PRO B NATRIURETIC PEPTIDE: Pro B Natriuretic peptide (BNP): 5542 pg/mL — ABNORMAL HIGH (ref 0–450)

## 2013-12-31 LAB — PREALBUMIN: Prealbumin: 13.6 mg/dL — ABNORMAL LOW (ref 17.0–34.0)

## 2013-12-31 MED ORDER — METOPROLOL TARTRATE 1 MG/ML IV SOLN
INTRAVENOUS | Status: AC
  Filled 2013-12-31: qty 5

## 2013-12-31 MED ORDER — IOHEXOL 350 MG/ML SOLN
80.0000 mL | Freq: Once | INTRAVENOUS | Status: AC | PRN
  Administered 2013-12-31: 80 mL via INTRAVENOUS

## 2013-12-31 MED ORDER — METOPROLOL TARTRATE 1 MG/ML IV SOLN
5.0000 mg | Freq: Once | INTRAVENOUS | Status: AC
  Administered 2013-12-31: 5 mg via INTRAVENOUS

## 2013-12-31 MED ORDER — PERFLUTREN LIPID MICROSPHERE
1.0000 mL | INTRAVENOUS | Status: AC | PRN
  Administered 2013-12-31: 2 mL via INTRAVENOUS
  Filled 2013-12-31: qty 10

## 2013-12-31 MED ORDER — CIPROFLOXACIN HCL 250 MG PO TABS: 250.0000 mg | ORAL_TABLET | Freq: Two times a day (BID) | ORAL | Status: DC

## 2013-12-31 MED ORDER — METOPROLOL TARTRATE 1 MG/ML IV SOLN
INTRAVENOUS | Status: AC
  Administered 2013-12-31: 5 mg via INTRAVENOUS
  Filled 2013-12-31: qty 5

## 2013-12-31 NOTE — Discharge Summary (Signed)
Discharge Summary   Patient ID: [name] MRN: [id number], DOB/AGE: [birth year] 80 y.o. Admit date: 12/29/2013 D/C date:     12/31/2013  Primary Care Provider: Rozanna Box, MD Primary Cardiologist: [facility], seen by Dr. Clifton James here for TAVR consideration  Primary Discharge Diagnoses:  1. Critical aortic valve stenosis 2. NICM - EF 40% by echo at [facility] on 12/26/13, results pending here 3. CAD s/p CABG 1998 at [facility] - elevated troponin felt secondary to demand ischemia and worsening AS  - cath 12/29/13: felt to be stable disease with patent grafts 4. UTI - Hafnia Alvei, Cipro sensitive 5. Fecal impaction, improved 6. Moderate dementia, apparently worsened by unfamiliar hospital setting 7. HTN 8. SVT s/p RFA at [facility] remotely, quiescent  Secondary Discharge Diagnoses:  1. HLD 2. DJD 3. Polio 4. Urinary incontinence  Hospital Course:[name] is an 80 y/o female with history of dementia, CAD s/p CABG in 1995, SVT s/p RFA, aortic stenosis, HTN, HLD transferred to [facility] on 12/29/13 from [facility] for further evaluation of her aortic valve stenosis. She was admitted to [facility] 12/24/13 with LLQ abdominal pain and intermittent chest pain. She was found to have fecal impaction that improved with bowel movement. WBC was also elevated to 25k per report and she was started on antibiotics for UTI. Her troponin was elevated with a peak of 0.56. January 2014 echo had shown normal LV systolic function with severe AS at that time. She has done well over the last year up until presenting this admission. She underwent cardiac cath at [facility] on 12/29/13 by Dr. Kirke Corin  with patent left main, 60% diffuse proximal LAD stenosis, 100% mid LAD stenosis with patent LIMA to mid LAD, 60% diagonal stenosis, 90% mid Circumflex stenosis, 100% distal Circumflex stenosis, 80% OM2 stenosis with patent vein graft, 100% proximal RCA occlusion with patent sequential vein graft to the PDA. This was  felt to be stable. Repeat echo on 12/26/13 showed LVEF=40-45% with critical aortic valve stenosis, mean gradient across aortic valve of 60 mmHg. She was subsequently sent to [facility] for consideration of TAVR.  Upon interview she was a poor historian. She has a poor functional status at baseline per family with at least moderate dementia. Much of the history was provided by her husband as she provided limited answers and frequently became agitated during the interview. Her husband reported that her dementia was worsening lately and she is often not aware of where she is. She has no longer been doing household chores like laundry and cooking over the last 12 months. She was not felt to be a favorable candidate for redo open surgery. Dr. Clifton James discussed the case with Dr. Cornelius Moras who has also done an extensive evaluation. Overall since transfer she remained asymptomatic from her AS thus workup will be continued as an outpatient in the multidisciplinary clinic. The limitation to considering TAVR would be advanced dementia, overall poor functional status and frail state. She had CT chest/abd/pelvis on 12/29/13. These results are pending. 2D echo was also performed today, results are also pending (this was repeated as echo quality poor from [facility]). She is without CP or SOB today. Dr. Clifton James has seen and examined the patient today and feels she is stable for discharge. He states that the followup appointment will be arranged by our TAVR team. She will follow up with Dr. Mariah Milling in 1-2 weeks. She will continue home meds, along with addition of Cipro which she will complete her course  on 2/27 PM.  ADDENDUM: the patient went down for cardiac CT before discharge. There was a technical difficulty with the machine and the table was unable to move. The system had to be restarted and therefore the patient was taken off the table to wait for 10 minutes. During this time she decided to refuse the test. Dr. Delton See explained to her the  situation but the patient adamantly refused to consider further testing. She was taken back up to her room in hopes that she may be more oriented when accompanied by her husband, but she continued to refuse this. Dr. Delton See d/w Dr. Clifton James and they both feel she is OK to be discharged home today. It is felt that her dementia is worsened by unfamiliar hospital setting and she may benefit from being back in her home environment. As above, she will followup in the valve clinic for continued outpatient workup/consideration for TAVR.  Discharge Vitals: Blood pressure 131/69, pulse 78, temperature 98 F (36.7 C), temperature source Oral, resp. rate 18, height 5\' 1"  (1.549 m), weight 104 lb (47.174 kg), SpO2 95.00%.  Labs: Lab Results  Component Value Date   WBC 9.3 12/30/2013   HGB 14.3 12/30/2013   HCT 44.3 12/30/2013   MCV 89.5 12/30/2013   PLT 254 12/30/2013    Recent Labs Lab 12/29/13 2140  12/31/13 0210  NA 141  < > 144  K 3.8  < > 3.8  CL 104  < > 104  CO2 23  < > 27  BUN 12  < > 15  CREATININE 0.60  < > 0.68  CALCIUM 9.6  < > 9.8  PROT 6.6  --   --   BILITOT 0.7  --   --   ALKPHOS 71  --   --   ALT 40*  --   --   AST 46*  --   --   GLUCOSE 108*  < > 87  < > = values in this interval not displayed.  UTI - Hafnia alvei, cipro sensitive (resistant to amp, cefazolin)  Diagnostic Studies/Procedures   2D Echo and CT chest/abd/pelvis are pending  Echo 12/26/13 [facility] - EF 40-45%, mild LVH, normal RV size/thickness/function. LA mildly dilated. Mild MR. Mild-mod TR. RVSP 37.62mmHg. Severe AS. Vmax 0.44. Mild AI. Trace PR.  Cardiac Cath 12/29/13 [facility] LM: normal LAD: normal sized and moderately calcified. Prox LAD: diffuse 60% stenosis. Mid LAD: 100% stenosis. 1st diagonal: diffuse 60% in prox third.  Cx: Moderately calcified. Mid - 90% stenosis. Distal - 100% stenosis. 1st OM - very small sized. 2nd OM - normal sized, 80% in prox third. RCA: normal sized. Prox RCA - 100% stenosis.  Grafts  patent per report.   Dg Chest 2 View 12/30/2013   CLINICAL DATA:  Rule out CHF.  EXAM: CHEST  2 VIEW  COMPARISON:  04/03/2006  FINDINGS: There is mild cardiac enlargement. No pleural effusions identified. Chronic interstitial coarsening is identified. There is mild superimposed interstitial edema.  IMPRESSION: 1. Cardiac enlargement and mild interstitial edema.   Electronically Signed   By: Signa Kell M.D.   On: 12/30/2013 16:19    Discharge Medications     Medication List         amLODipine 5 MG tablet  Commonly known as:  NORVASC  Take 5 mg by mouth daily.     aspirin 81 MG tablet  Take 81 mg by mouth daily.     celecoxib 200 MG capsule  Commonly known as:  CELEBREX  Take 200 mg by mouth daily.     CENTRUM SILVER PO  Take 1 tablet by mouth daily.     ciprofloxacin 250 MG tablet  Commonly known as:  CIPRO  Take 1 tablet (250 mg total) by mouth 2 (two) times daily.     citalopram 20 MG tablet  Commonly known as:  CELEXA  Take 20 mg by mouth daily.     CITRACAL + D PO  Take 1 tablet by mouth daily.     EXELON TD  Place 8 mg onto the skin daily.     losartan 50 MG tablet  Commonly known as:  COZAAR  Take 50 mg by mouth daily.     metoprolol succinate 100 MG 24 hr tablet  Commonly known as:  TOPROL-XL  Take 100 mg by mouth daily. Take with or immediately following a meal.     TOVIAZ 8 MG Tb24 tablet  Generic drug:  fesoterodine  Take 8 mg by mouth daily.        Disposition   The patient will be discharged in stable condition to home. Discharge Orders   Future Appointments Provider Department Dept Phone   01/09/2014 1:45 PM Antonieta Ibaimothy J Gollan, MD [facility] [phone]   Future Orders Complete By Expires   Diet - low sodium heart healthy  As directed    Discharge instructions  As directed    Comments:     A prescription was sent to your pharmacy to finish the antibiotic for your urinary tract infection.   Discharge instructions  As directed     Comments:     One of your heart tests showed weakness of the heart muscle and narrowing of your heart valve. This may make you more susceptible to weight gain from fluid retention, which can lead to symptoms that we call heart failure. For patients with congestive heart failure, we give them these special instructions:  1. Follow a low-salt diet and watch your fluid intake. In general, you should not be taking in more than 2 liters of fluid per day (no more than 8 glasses per day). Some patients are restricted to less than 1.5 liters of fluid per day (no more than 6 glasses per day). This includes sources of water in foods like soup, coffee, tea, milk, etc. 2. Weigh yourself on the same scale at same time of day and keep a log. 3. Call your doctor: (Anytime you feel any of the following symptoms)  - 3-4 pound weight gain in 1-2 days or 2 pounds overnight  - Shortness of breath, with or without a dry hacking cough  - Swelling in the hands, feet or stomach  - If you have to sleep on extra pillows at night in order to breathe  IT IS IMPORTANT TO LET YOUR DOCTOR KNOW EARLY ON IF YOU ARE HAVING SYMPTOMS SO WE CAN HELP YOU!   Increase activity slowly  As directed    Scheduling Instructions:     No driving. No lifting over 5 lbs for 1 week. No sexual activity until cleared by your cardiologist. Keep procedure site clean & dry. If you notice increased pain, swelling, bleeding or pus, call/return!  You may shower, but no soaking baths/hot tubs/pools for 1 week.     Follow-up Information   Follow up with Julien Nordmannimothy Gollan, MD On 01/09/2014. (You do NOT need to come to your previously scheduled appointment with Dr. Mariah MillingGollan tomorrow. This has been rescheduled to Friday, March 6th  at 1:45pm.)    Specialty:  Cardiology   Contact information:   [facility] [address] [place] [address] [phone]       Follow up with Valve Clinic. (The [facility] will call  you for a follow-up appointment.)         Duration of Discharge Encounter: Greater than 30 minutes including physician and PA time.  Signed, Ronie Spies PA-C 12/31/2013, 2:21 PM

## 2013-12-31 NOTE — Discharge Summary (Signed)
See full note this am. cdm 

## 2013-12-31 NOTE — Progress Notes (Signed)
Went over discharge instructions with patient, husband, and patient's son. All verbalized understanding. No additional questions or concerns related to discharge. IV d/c'd, patient taken off cardiac monitor. Patient discharged home with family. Stanton Kidney R

## 2013-12-31 NOTE — Progress Notes (Signed)
SUBJECTIVE: No chest pain or SOB.   BP 125/77  Pulse 83  Temp(Src) 97.7 F (36.5 C) (Oral)  Resp 18  Ht 5\' 1"  (1.549 m)  Wt 104 lb (47.174 kg)  BMI 19.66 kg/m2  SpO2 97%  Intake/Output Summary (Last 24 hours) at 12/31/13 1018 Last data filed at 12/30/13 1700  Gross per 24 hour  Intake    240 ml  Output      0 ml  Net    240 ml    PHYSICAL EXAM General: Well developed, well nourished, in no acute distress. Alert, only oriented to person but not place or time.  Psych: Good affect, responds appropriately  Neck: No JVD. No masses noted.  Lungs: Clear bilaterally with no wheezes or rhonci noted.  Heart: RRR with loud hard systolic murmur.  Abdomen: Bowel sounds are present. Soft, non-tender.  Extremities: No lower extremity edema.   LABS: Basic Metabolic Panel:  Recent Labs  92/44/62 0301 12/31/13 0210  NA 140 144  K 3.7 3.8  CL 103 104  CO2 23 27  GLUCOSE 94 87  BUN 12 15  CREATININE 0.63 0.68  CALCIUM 9.8 9.8   CBC:  Recent Labs  12/29/13 2140 12/30/13 0301  WBC 9.5 9.3  NEUTROABS 7.0  --   HGB 14.5 14.3  HCT 42.4 44.3  MCV 88.9 89.5  PLT 245 254   Current Meds: . amLODipine  5 mg Oral Daily  . aspirin EC  81 mg Oral Daily  . celecoxib  200 mg Oral Daily  . ciprofloxacin  250 mg Oral BID  . citalopram  20 mg Oral Daily  . fesoterodine  8 mg Oral QHS  . losartan  50 mg Oral Daily  . metoprolol succinate  100 mg Oral QAC breakfast  . rivastigmine  4.6 mg Transdermal Daily   ASSESSMENT AND PLAN:  1. Severe aortic valve stenosis: She has severe aortic valve stenosis with mean gradient of 60 mm Hg. She has had minimal symptoms to be contributed to her AS. She is now transferred to [facility] for consideration for traditional AVR vs transcatheter AVR. She has a poor functional status at baseline per family with at least moderate dementia. Her activities of daily living include walking around the house with a walker but she no longer performs  any simple tasks such as laundry, cooking or cleaning. Per her family, her dementia seems to be progressing. She has had prior open surgery in 1995 when she underwent CABG. She would not be a favorable candidate for redo open surgery. She will be considered for TAVR. I have discussed her case with Dr. Cornelius Moras who has also done an extensive evaluation and at this time, she is overall asymptomatic from her AS.  -CTA chest/abd/pelvis yesterday and cardiac CT today as part of TAVR workup. The results will not need to be back before she is discharged today.  -repeat echo today with contrast to get better assessment of EF as echo quality poor from [facility]. If this echo cannot be done early in the day today, will perform as an outpatient.  -She will be followed after discharge in the Multidisciplinary valve clinic. The limitation to considering TAVR would be advanced dementia, overall poor functional status and frail state. Appointment will be arranged by our TAVR team. -Will continue home medications.  -Discharge home this afternoon after cardiac CT if her husband can get here to pick her up. I attempted to call the home number  this am but no answer. He does not have a cell phone. He may be driving in now.   2. Non-ischemic cardiomyopathy: New LV systolic dysfunction with LVEF=40% [facility]. Repeat echo here today to reassess LVEF if it can be done early. Continue medical management   3. CAD s/p CABG: St[facility]. 4 patent grafts. Continue medical management   4. SVT: s/p catheter ablation. No recent recurrence.   5. UTI: Complete 7 day course of Cipro. She will need two more days of therapy.     Romell Wolden  2/25/201510:18 AM

## 2013-12-31 NOTE — Progress Notes (Signed)
  Echocardiogram 2D Echocardiogram with Definity has been performed.  [name] 12/31/2013, 12:22 PM

## 2014-01-01 ENCOUNTER — Encounter: Payer: Medicare Other | Admitting: Cardiovascular Disease

## 2014-01-01 MED FILL — Perflutren Lipid Microsphere IV Susp 1.1 MG/ML: INTRAVENOUS | Qty: 10 | Status: AC

## 2014-01-09 ENCOUNTER — Encounter: Payer: Medicare Other | Admitting: Cardiovascular Disease

## 2014-01-22 ENCOUNTER — Encounter: Payer: Medicare Other | Admitting: Cardiovascular Disease

## 2014-01-23 ENCOUNTER — Encounter (HOSPITAL_COMMUNITY): Payer: Self-pay | Admitting: Cardiovascular Disease

## 2014-01-23 ENCOUNTER — Encounter (HOSPITAL_COMMUNITY): Payer: Self-pay | Admitting: Thoracic Surgery (Cardiothoracic Vascular Surgery)

## 2014-01-23 ENCOUNTER — Encounter (HOSPITAL_COMMUNITY): Payer: Medicare Other | Admitting: Thoracic Surgery (Cardiothoracic Vascular Surgery)

## 2014-01-23 ENCOUNTER — Ambulatory Visit (HOSPITAL_COMMUNITY)
Admission: RE | Admit: 2014-01-23 | Discharge: 2014-01-23 | Disposition: A | Discharge status: Home / Self Care | Payer: Medicare Other | Source: Ambulatory Visit | Attending: Thoracic Surgery (Cardiothoracic Vascular Surgery) | Admitting: Thoracic Surgery (Cardiothoracic Vascular Surgery)

## 2014-01-23 ENCOUNTER — Encounter (HOSPITAL_COMMUNITY): Payer: Medicare Other | Admitting: Cardiovascular Disease

## 2014-01-23 ENCOUNTER — Ambulatory Visit (HOSPITAL_BASED_OUTPATIENT_CLINIC_OR_DEPARTMENT_OTHER)
Admission: RE | Admit: 2014-01-23 | Discharge: 2014-01-23 | Disposition: A | Discharge status: Home / Self Care | Payer: Medicare Other | Source: Ambulatory Visit | Attending: Cardiovascular Disease | Admitting: Cardiovascular Disease

## 2014-01-23 VITALS — BP 110/60 | HR 60 | Resp 17 | Ht 61.0 in | Wt 106.8 lb

## 2014-01-23 DIAGNOSIS — I359 Nonrheumatic aortic valve disorder, unspecified: Secondary | ICD-10-CM

## 2014-01-23 DIAGNOSIS — I350 Nonrheumatic aortic (valve) stenosis: Secondary | ICD-10-CM

## 2014-01-23 NOTE — Progress Notes (Signed)
MULTIDISCIPLINARY HEART VALVE CLINIC NOTE  Patient ID: Cathleen Fearsnn T Bernales MRN: [id number] DOB/AGE: 05/24/1931 80 y.o.  Primary Care Physician:BABAOFF, Lavada MesiMARC E, MD Primary Cardiologist: Dr Mariah MillingGollan  HPI: [id number] year old woman presenting for followup evaluation in the multidisciplinary heart valve clinic. The patient has a history of coronary artery disease status post remote CABG in the 1990s. She also has a history of supraventricular tachycardia and has [facility]. She's been followed for aortic stenosis over time without significant symptoms of exertional dyspnea or congestive heart failure. However, she was hospitalized in February with a urinary tract infection. She was found to have elevated troponin levels and underwent echocardiography demonstrating severe aortic stenosis with LV dysfunction. Cardiac catheterization was performed and this demonstrated severe native coronary artery disease with continued patency of all bypass grafts. [facility] was [facility] where she underwent evaluation by the heart valve team. She had difficulty with inpatient evaluation because of hospital related delirium on top of chronic dementia. She was ultimately discharged home and scheduled to return today for further discussion.  The patient presents today with her husband and her son. She reports minimal cardiac symptoms at home. However, she admits that her lifestyle is extremely sedentary. She does not have shortness of breath, chest discomfort, lightheadedness, or syncope with her current activity level. The patient has significant limitation related to her cognitive decline. Her husband expresses that he worries about leaving her for any period of time. She has lost 15-20 pounds over the past one to 2 years.  Past Medical History  Diagnosis Date  . Critical aortic valve stenosis     a. Relatively asymptomatic until 12/2013 - in workup for TAVR.  Marland Kitchen. CAD (coronary artery  disease)     a. [facility]/p [facility] -  including LIMA to LAD, LRA to OM, SVG to RCA with open vein harvest left thigh.  . HTN (hypertension)   . Hyperlipidemia   . SVT (supraventricular tachycardia)     a. s/p ablation [facility].  . Polio     childhood  . Urinary incontinence   . Fecal impaction 12/24/2013  . UTI (urinary tract infection) 12/24/2013    >100,000 col/mL Hafnia alvei  . S/P CABG x 3 04/02/1997    [facility] including LIMA to LAD, LRA to OM, SVG to RCA with open vein harvest left thigh  . Heart murmur   . History of blood transfusion 1998    "pretty sure she had to have blood when she had bypass surgery  . DJD (degenerative joint disease)   . Osteoarthritis   . Arthritis     "pretty bad"  . Depression     "related to son on combat duty in [place]"   . Moderate dementia     "at times it's pretty bad" (12/31/2013)    Past Surgical History  Procedure Laterality Date  . Abdominal hysterectomy    . Bunionectomy    . Ganglion cyst excision    . Cholecystectomy    . Coronary artery bypass graft  04/02/1997    [facility] using LIMA to LAD, LRA to OM, SVG to RCA with open vein harvest left thigh  . Total knee arthroplasty Left   . Total knee arthroplasty Right   . Atrial tach ablation      [facility]  . Ankle arthroplasty Right ~ 19138    "polio"  . Tonsillectomy    . Carpal tunnel release Bilateral     "  I think she's had both sides" (12/31/2013)  . Cataract extraction w/ intraocular lens  implant, bilateral Bilateral   . Cardiac catheterization      "more than 1"   . Coronary angioplasty      Family History  Problem Relation Age of Onset  . Heart attack Father   . Heart attack Brother   . Heart attack Mother     History   Social History  . Marital Status: Married    Spouse Name: N/A    Number of Children: N/A  . Years of Education: N/A   Occupational History  . Retired    Social History Main Topics  . Smoking status: Never Smoker   . Smokeless tobacco:  Never Used  . Alcohol Use: No  . Drug Use: No  . Sexual Activity: No   Other Topics Concern  . Not on file   Social History Narrative  . No narrative on file     Prior to Admission medications   Medication Sig Start Date End Date Taking? Authorizing Provider  amLODipine (NORVASC) 5 MG tablet Take 5 mg by mouth daily.   Yes Historical Provider, MD  aspirin 81 MG tablet Take 81 mg by mouth daily.   Yes Historical Provider, MD  Calcium Citrate-Vitamin D (CITRACAL + D PO) Take 1 tablet by mouth daily.   Yes Historical Provider, MD  celecoxib (CELEBREX) 200 MG capsule Take 200 mg by mouth daily.   Yes Historical Provider, MD  ciprofloxacin (CIPRO) 250 MG tablet Take 1 tablet (250 mg total) by mouth 2 (two) times daily. 12/31/13  Yes Dayna N Dunn, PA-C  citalopram (CELEXA) 20 MG tablet Take 20 mg by mouth daily.   Yes Historical Provider, MD  fesoterodine (TOVIAZ) 8 MG TB24 tablet Take 8 mg by mouth daily.   Yes Historical Provider, MD  losartan (COZAAR) 50 MG tablet Take 50 mg by mouth daily.   Yes Historical Provider, MD  metoprolol succinate (TOPROL-XL) 100 MG 24 hr tablet Take 100 mg by mouth daily. Take with or immediately following a meal.   Yes Historical Provider, MD  Multiple Vitamins-Minerals (CENTRUM SILVER PO) Take 1 tablet by mouth daily.   Yes Historical Provider, MD  Rivastigmine (EXELON TD) Place 8 mg onto the skin daily.   Yes Historical Provider, MD  metroNIDAZOLE (FLAGYL) 500 MG tablet  01/06/14   Historical Provider, MD    Allergies  Allergen Reactions  . Penicillins Other (See Comments)    Unknown  . Isosorbide Rash    ROS:  General: no fevers/chills/night sweats, positive for weight loss and poor appetite Eyes: no blurry vision, diplopia, or amaurosis ENT: no sore throat or hearing loss Resp: no cough, wheezing, or hemoptysis CV: no edema or palpitations GI: no abdominal pain, nausea, vomiting, diarrhea, or constipation GU: no dysuria, frequency, or hematuria.  Recent UTI. Skin: no rash Neuro: no headache, numbness, tingling, or weakness of extremities. Positive for gait instability, memory impairment Musculoskeletal: no joint pain or swelling Heme: no bleeding, DVT, or easy bruising Endo: no polydipsia or polyuria  BP 110/60  Pulse 60  Resp 17  Ht 5\' 1"  (1.549 m)  Wt 106 lb 12.8 oz (48.444 kg)  BMI 20.19 kg/m2  SpO2 92%  PHYSICAL EXAM: Pt is alert and oriented, pleasant elderly woman in NAD. HEENT: normal Neck: JVP normal. Carotid upstrokes delayed.  Lungs: equal expansion, clear bilaterally CV: RRR with grade 3/6 harsh late peaking systolic murmur at the RUSB with absent A2  Abd: soft, NT Back: no CVA tenderness Ext: no C/C/E    Skin: warm and dry without rash  2D ECHO:  Left ventricle: The cavity size was normal. Wall thickness was normal. Systolic function was severely reduced. The estimated ejection fraction was in the range of 20% to 25%. Diffuse hypokinesis. There was an increased relative contribution of atrial contraction to ventricular filling. The tissue Doppler parameters were abnormal.  ------------------------------------------------------------ Aortic valve: Moderately thickened, moderately calcified leaflets. Doppler: There was severe stenosis. Mild regurgitation. VTI ratio of LVOT to aortic valve: 0.12. Valve area: 0.25cm^2(VTI). Indexed valve area: 0.18cm^2/m^2 (VTI). Peak velocity ratio of LVOT to aortic valve: 0.13. Valve area: 0.27cm^2 (Vmax). Indexed valve area: 0.19cm^2/m^2 (Vmax). Mean gradient: 64mm Hg (S). Peak gradient: 97mm Hg (S).  ------------------------------------------------------------ Aorta: Aortic root: The aortic root was normal in size. Ascending aorta: The ascending aorta was normal in size.  ------------------------------------------------------------ Mitral valve: Moderately thickened, mildly calcified leaflets . Doppler: Mild regurgitation. Peak gradient: 3mm Hg  (D).  ------------------------------------------------------------ Left atrium: The atrium was normal in size.  ------------------------------------------------------------ Right ventricle: The cavity size was normal. Systolic function was normal.  ------------------------------------------------------------ Pulmonic valve: The valve appears to be grossly normal. Doppler: No significant regurgitation.  ------------------------------------------------------------ Tricuspid valve: Structurally normal valve. Leaflet separation was normal. Doppler: Transvalvular velocity was within the normal range. Moderate regurgitation.  ------------------------------------------------------------ Pulmonary artery: Systolic pressure was at the upper limits of normal.  ------------------------------------------------------------ Right atrium: The atrium was normal in size.  ------------------------------------------------------------ Pericardium: There was no pericardial effusion.  ------------------------------------------------------------ Systemic veins: Inferior vena cava: The vessel was normal in size; the respirophasic diameter changes were in the normal range (= 50%); findings are consistent with normal central venous pressure.  STS Risk Calculator  Procedure AVR + redo CABG  Risk of Mortality 10.8%  Morbidity or Mortality 36.9%  Prolonged LOS 13.7%  Short LOS 12.5%  Permanent Stroke 4.0%  Prolonged Vent Support 27.4%  DSW Infection 0.3%  Renal Failure 7.5%  Reoperation 13.6%  ASSESSMENT AND PLAN:  This is an 80 year old woman with severe aortic stenosis and severe LV dysfunction with LVEF less than 30%. She clearly has critical aortic stenosis with a mean transaortic gradient greater than 60 mm mercury in the setting of markedly depressed LV systolic function. We had extensive discussion today with the patient and her family. We spent greater than 60 minutes face-to-face  discussion related to natural history and treatment options related to her aortic stenosis. The patient is clear in her wishes not to pursue interventional or surgical therapy for her aortic stenosis. She states repeatedly that she is at peace and does not want to undergo invasive procedures. The patient's husband inquired about specific details of TAVR and we reviewed the procedure extensively with the family. The specific limitations of medical therapy were reviewed and the patient and family understand that she truly has critical aortic stenosis with LV dysfunction and will likely develop progressive symptoms of CHF and/or death over the next 6-12 months. They asked appropriate questions about palliation of symptoms. All questions were answered. They will continue follow-up with Dr Mariah Milling and will call if further questions.   Tonny Bollman 01/26/2014 11:24 PM

## 2014-01-23 NOTE — Progress Notes (Signed)
[facility]   MULTIDISCIPLINARY HEART VALVE CLINIC       CARDIOTHORACIC SURGERY NOTE  Referring Provider is Iran Ouch, MD PCP is BABAOFF, Lavada Mesi, MD   HPI:  Patient is an 80 year old female from [place] with history of dementia, coronary artery disease status post coronary artery bypass grafting in the distant past, aortic stenosis, hypertension, hyperlipidemia, and degenerative arthritis who returns to the multidisciplinary heart valve clinic for followup of severe aortic stenosis.  The patient's cardiac history dates back more than 15 years ago when she presented with symptomatic coronary artery disease. She initially was treated with percutaneous coronary angioplasty, but ultimately she underwent CABG x3 at [facility] in 1998. Grafts placed at the time of surgery included the LIMA to the LAD, SVG to the RCA, and LRA to the OM with the proximal end of the LRA taken off of the proximal end of the SVG.  She reportedly recovered from her bypass surgery uneventfully although she later developed recurrent supraventricular tachycardia for which she ultimately underwent ablation at [facility] in 2002. She has not had any further signs of ischemic heart disease or congestive heart failure since that time. She developed a heart murmur on physical exam and was found to have aortic stenosis which has been followed intermittently with serial echocardiograms. She has never had any symptoms of exertional shortness of breath or other signs of congestive heart failure.   The patient was hospitalized acutely at [facility] on 12/24/2013 with acute abdominal pain. She was found to have urinary tract infection and fecal impaction. Her fecal impaction cleared easily after she was given oral contrast for CT scan of the abdomen and pelvis and her abdominal pain resolved. Blood work obtained at the time of her presentation was notable for mildly elevated troponin  levels which prompted cardiac evaluation. Transthoracic echocardiogram performed 12/25/2013 revealed severe aortic stenosis with mild to moderate left ventricular dysfunction. Left and right heart catheterization was performed 12/29/2013. This demonstrated the presence of severe three-vessel coronary artery disease but the patient had widely patent grafts from her previous bypass surgery. Pulmonary artery pressures were mildly elevated. Transvalvular gradient across the aortic valve was not assessed at catheterization. The patient was transferred to [facility] for further diagnostic workup.  I had the opportunity to evaluate her during that hospitalization and saw her in consultation on 12/30/2013.  By the time she had arrived to [facility] she was demonstrating signs of severe delirium complicating her underlying chronic dementia.  However, she repeatedly stated that she didn't want to have any kind of surgery performed and that she felt that she was "ready to die and go to heaven."  She refused to have a cardiac-gated CT scan performed.  After discussions with her husband a decision was made to let her go home but to offer them follow up in the valve clinic to discuss options further once she had recovered from her acute illness.  The patient returns to the valve clinic with her husband and son.  Both she and her husband state that she is not having any symptoms of shortness of breath or chest discomfort, with or without exertion.  However, they both admit that she is very sedentary with very limited activity.  She cannot recall what she did yesterday or the day before.  She is reportedly somewhat unsteady on her feet and prone to falls.  She ambulates limited amounts in their home using a walker.  She has not had dizzy spells nor syncope.  She has a poor appetite and has lost 15-20 lbs over the past year.  Her husband and son note that her confusion has progressed considerably and her functional status  deteriorated over the past year.  Her husband is no longer comfortable leaving her unattended for fear that she might fall or hurt herself.    Current Outpatient Prescriptions  Medication Sig Dispense Refill  . amLODipine (NORVASC) 5 MG tablet Take 5 mg by mouth daily.      Marland Kitchen. aspirin 81 MG tablet Take 81 mg by mouth daily.      . Calcium Citrate-Vitamin D (CITRACAL + D PO) Take 1 tablet by mouth daily.      . celecoxib (CELEBREX) 200 MG capsule Take 200 mg by mouth daily.      . ciprofloxacin (CIPRO) 250 MG tablet Take 1 tablet (250 mg total) by mouth 2 (two) times daily.  5 tablet  0  . citalopram (CELEXA) 20 MG tablet Take 20 mg by mouth daily.      . fesoterodine (TOVIAZ) 8 MG TB24 tablet Take 8 mg by mouth daily.      Marland Kitchen. losartan (COZAAR) 50 MG tablet Take 50 mg by mouth daily.      . metoprolol succinate (TOPROL-XL) 100 MG 24 hr tablet Take 100 mg by mouth daily. Take with or immediately following a meal.      . metroNIDAZOLE (FLAGYL) 500 MG tablet       . Multiple Vitamins-Minerals (CENTRUM SILVER PO) Take 1 tablet by mouth daily.      . Rivastigmine (EXELON TD) Place 8 mg onto the skin daily.       No current facility-administered medications for this encounter.      Physical Exam:   BP 110/60  Pulse 60  Resp 17  Ht 5\' 1"  (1.549 m)  Wt 106 lb 12.8 oz (48.444 kg)  BMI 20.19 kg/m2  SpO2 92%  General:  Elderly and frail but pleasant and cooperative  Chest:   clear  CV:   RRR w/ harsh crescendo/decrescendo systolic murmur  Incisions:  n/a  Abdomen:  soft  Extremities:  Warm, no edema  Diagnostic Tests:   Transthoracic Echocardiography  Patient: Cathleen FearsSharpe, Sylvia T MR #: [id number] Study Date: 12/31/2013 Gender: F Age: 75 Height: 154.9cm Weight: 47.3kg BSA: 1.6131m^2 Pt. Status: Room: 2W37C  SONOGRAPHER Nolon Rodony Brown, RDCS ADMITTING Verne CarrowMcAlhany, Christopher ATTENDING Teresita MaduraMcAlhany, Christopher ORDERING McAlhany, Christopher REFERRING McAlhany, Christopher PERFORMING [facility],  Inpatient cc:  ------------------------------------------------------------ LV EF: 20% - 25%  ------------------------------------------------------------ Indications: Aortic stenosis 424.1.  ------------------------------------------------------------ History: PMH: Coronary artery disease. Aortic valve disease. Risk factors: Dementia. Non ischemic Cardiomyopathy.  ------------------------------------------------------------ Study Conclusions  - Left ventricle: The cavity size was normal. Wall thickness was normal. Systolic function was severely reduced. The estimated ejection fraction was in the range of 20% to 25%. Diffuse hypokinesis. There was an increased relative contribution of atrial contraction to ventricular filling. - Aortic valve: There was severe stenosis. Mild regurgitation. Valve area: 0.25cm^2(VTI). Valve area: 0.27cm^2 (Vmax). - Mitral valve: Mild regurgitation. - Tricuspid valve: Moderate regurgitation. - Pulmonary arteries: PA peak pressure: 36mm Hg (S).  ------------------------------------------------------------ Labs, prior tests, procedures, and surgery: Coronary artery bypass grafting.  Transthoracic echocardiography. M-mode, complete 2D, spectral Doppler, and color Doppler. Height: Height: 154.9cm. Height: 61in. Weight: Weight: 47.3kg. Weight: 104lb. Body mass index: BMI: 19.7kg/m^2. Body surface area: BSA: 1.3731m^2. Blood pressure: 125/77. Patient status: Inpatient. Location: Bedside.  ------------------------------------------------------------  ------------------------------------------------------------ Left  ventricle: The cavity size was normal. Wall thickness was normal. Systolic function was severely reduced. The estimated ejection fraction was in the range of 20% to 25%. Diffuse hypokinesis. There was an increased relative contribution of atrial contraction to ventricular filling. The tissue Doppler parameters were  abnormal.  ------------------------------------------------------------ Aortic valve: Moderately thickened, moderately calcified leaflets. Doppler: There was severe stenosis. Mild regurgitation. VTI ratio of LVOT to aortic valve: 0.12. Valve area: 0.25cm^2(VTI). Indexed valve area: 0.18cm^2/m^2 (VTI). Peak velocity ratio of LVOT to aortic valve: 0.13. Valve area: 0.27cm^2 (Vmax). Indexed valve area: 0.19cm^2/m^2 (Vmax). Mean gradient: 64mm Hg (S). Peak gradient: 97mm Hg (S).  ------------------------------------------------------------ Aorta: Aortic root: The aortic root was normal in size. Ascending aorta: The ascending aorta was normal in size.  ------------------------------------------------------------ Mitral valve: Moderately thickened, mildly calcified leaflets . Doppler: Mild regurgitation. Peak gradient: 3mm Hg (D).  ------------------------------------------------------------ Left atrium: The atrium was normal in size.  ------------------------------------------------------------ Right ventricle: The cavity size was normal. Systolic function was normal.  ------------------------------------------------------------ Pulmonic valve: The valve appears to be grossly normal. Doppler: No significant regurgitation.  ------------------------------------------------------------ Tricuspid valve: Structurally normal valve. Leaflet separation was normal. Doppler: Transvalvular velocity was within the normal range. Moderate regurgitation.  ------------------------------------------------------------ Pulmonary artery: Systolic pressure was at the upper limits of normal.  ------------------------------------------------------------ Right atrium: The atrium was normal in size.  ------------------------------------------------------------ Pericardium: There was no pericardial effusion.  ------------------------------------------------------------ Systemic veins: Inferior vena  cava: The vessel was normal in size; the respirophasic diameter changes were in the normal range (= 50%); findings are consistent with normal central venous pressure.  ------------------------------------------------------------  2D measurements Normal Doppler measurements Normal Left ventricle Main pulmonary LVID ED, 45.6 mm 43-52 artery chord, Pressure, 36 mm Hg =30 PLAX S LVID ES, 36.8 mm 23-38 Left ventricle chord, Ea, lat 3.07 cm/s ------ PLAX Rickell, tiss FS, chord, 19 % >29 DP PLAX E/Ea, lat 28.3 ------ LVPW, ED 8.99 mm ------ Marshay, tiss 1 IVS/LVPW 1.1 <1.3 DP ratio, ED Ea, med 3.95 cm/s ------ Ventricular septum Nejla, tiss IVS, ED 9.89 mm ------ DP LVOT E/Ea, med 22 ------ Diam, S 16 mm ------ Toluwanimi, tiss Area 2.01 cm^2 ------ DP Aorta LVOT Root diam, 23 mm ------ Peak vel, 65.4 cm/s ------ ED S Left atrium VTI, S 15.4 cm ------ AP dim 38 mm ------ Aortic valve AP dim 2.67 cm/m^2 <2.2 Peak vel, 493 cm/s ------ index S Mean vel, 383 cm/s ------ S VTI, S 124 cm ------ Mean 64 mm Hg ------ gradient, S Peak 97 mm Hg ------ gradient, S VTI ratio 0.12 ------ LVOT/AV Area, VTI 0.25 cm^2 ------ Area index 0.18 cm^2/m ------ (VTI) ^2 Peak vel 0.13 ------ ratio, LVOT/AV Area, Vmax 0.27 cm^2 ------ Area index 0.19 cm^2/m ------ (Vmax) ^2 Regurg PHT 265 ms ------ Mitral valve Peak E vel 86.9 cm/s ------ Peak A vel 112 cm/s ------ Decelerati 155 ms 150-23 on time 0 Peak 3 mm Hg ------ gradient, D Peak E/A 0.8 ------ ratio Tricuspid valve Regurg 289 cm/s ------ peak vel Peak RV-RA 33 mm Hg ------ gradient, S Systemic veins Estimated 3 mm Hg ------ CVP Right ventricle Pressure, 36 mm Hg <30 S  ------------------------------------------------------------ Prepared and Electronically Authenticated by  Kristeen Miss [id number]       CT ANGIOGRAPHY CHEST, ABDOMEN AND PELVIS  TECHNIQUE:  Multidetector CT imaging through the chest,  abdomen and pelvis was  performed using the standard protocol during bolus administration of  intravenous contrast. Multiplanar reconstructed images and MIPs were  obtained and reviewed  to evaluate the vascular anatomy.  CONTRAST: OMNIPAQUE IOHEXOL 350 MG/ML SOLN  COMPARISON: None.  FINDINGS:  CT CHEST FINDINGS  Mediastinum: Heart size is enlarged. There is no significant  pericardial fluid, thickening or pericardial calcification. There is  atherosclerosis of the thoracic aorta, the great vessels of the  mediastinum and the coronary arteries, including calcified  atherosclerotic plaque in the left main, left anterior descending,  left circumflex and right coronary arteries. Status post median  sternotomy for CABG, including LIMA to the LAD. Importantly, the  patient also appears to have a saphenous vein graft to the distal  RCA circulation which is immediately deep to the sternum just to the  left of midline, in a position at high risk for potential injury  should a repeat sternotomy be performed. Thickened and calcified  aortic valve. No pathologically enlarged mediastinal or hilar lymph  nodes. Esophagus is unremarkable in appearance.  Lungs/Pleura: 6 mm nodule in the apex of the left upper lobe (image  23 of series 12). No other larger more suspicious appearing  pulmonary nodules or masses are otherwise noted. No acute  consolidative airspace disease. No pleural effusions. Patchy areas  of mild subpleural reticulation and architectural distortion, most  pronounced in the extreme lung bases where there appears to be some  mild peripheral bronchiolectasis. No frank honeycombing is  identified at this time.  Musculoskeletal: Sternotomy wires. There are no aggressive appearing  lytic or blastic lesions noted in the visualized portions of the  skeleton.  CT ABDOMEN AND PELVIS FINDINGS  Abdomen/Pelvis: Status post cholecystectomy. The appearance of the  liver, pancreas, spleen  and bilateral adrenal glands is  unremarkable. Mild bilateral renal atrophy with multifocal areas of  cortical thinning in the right kidney, most compatible with areas of  scarring from prior infection/infarction. The distal rectum is  decompressed, but there appears to be some circumferential what  rectal wall thickening. Proximal rectum and colon are otherwise  generally unremarkable. No significant volume of ascites. No  pneumoperitoneum. No pathologic distention of small bowel. No  lymphadenopathy identified in the abdomen or pelvis. Post procedural  changes in the right groin, likely related to recent arterial  access.  Musculoskeletal: 4 mm of anterolisthesis of L4 upon L5. S-shaped  scoliosis of the thoracolumbar spine convex to the left in the lower  thoracic spine, and to the right at the level of L3. There are no  aggressive appearing lytic or blastic lesions noted in the  visualized portions of the skeleton.  VASCULAR MEASUREMENTS PERTINENT TO TAVR:  AORTA:  Minimal Aortic Diameter - 7.5 x 6.4 mm immediately beneath the level  of the renal arteries  Severity of Aortic Calcification - Moderate  RIGHT PELVIS:  Right Common Iliac Artery -  Minimal Diameter - 5.4 x 5.0 mm  Tortuosity - Mild  Calcification - Mild  Right External Iliac Artery -  Minimal Diameter - 5.3 x 5.3 mm  Tortuosity - Mild  Calcification - Mild  Right Common Femoral Artery -  Minimal Diameter - 5.5 x 5.7 mm  Tortuosity - Mild  Calcification - Mild  LEFT PELVIS:  Left Common Iliac Artery -  Minimal Diameter - 6.7 x 5.8 mm  Tortuosity - Mild  Calcification - Mild  Left External Iliac Artery -  Minimal Diameter - 6.2 x 6.1 mm  Tortuosity - Mild  Calcification -Mild  Left Common Femoral Artery -  Minimal Diameter - 6.2 x 6.2 mm  Tortuosity - Mild  Calcification -  Mild  Review of the MIP images confirms the above findings.  IMPRESSION:  1. Although the patient has small pelvic arteries, she does  appear  to have suitable pelvic arterial access on the left side for  potential TAVR procedure, as detailed above.  2. Status post median sternotomy for CABG including LIMA to the LAD.  Additionally, the patient has what appears to be a saphenous vein  graft extending to the distal RCA circulation. This graft is in an  unusual anterior position immediately deep to the sternum placing  the graft at risk for potential injury should a repeat sternotomy be  attempted.  3. The appearance of the lung parenchyma could suggest early changes  of interstitial lung disease, but is nonspecific at this time.  Repeat high-resolution chest CT is suggested in one year to assess  for temporal changes in the appearance of the lung parenchyma.  4. Circumferential thickening of the distal rectum. This may in part  be artifactual related to decompression of the distal rectum,  however, correlation with sigmoidoscopy or colonoscopy is suggested  in the near future to exclude the possibility of a rectal neoplasm.  5. Additional incidental findings, as above.  Electronically Signed  By: Trudie Reed M.D.  On: 12/31/2013 13:47    Impression:  The patient has extremely severe aortic stenosis with at least moderate left ventricular dysfunction.  She has severe three-vessel coronary artery disease, but all 3 bypass grafts from her previous coronary artery bypass surgery remain patent. She does not report any symptoms of congestive heart failure, although her activity level is extremely limited. The patient's functional status has reportedly deteriorated substantially over the past year because of underlying dementia.  Since hospital discharge she has improved and apparently recovered to her cognitive baseline.  Nevertheless, it is clear that she has at least moderate dementia with a very limited functional capacity.  I would not consider this patient a candidate for conventional surgical aortic valve replacement  under any circumstances.  I am reluctant to consider transcatheter aortic valve replacement as an alternative option for the treatment of aortic stenosis, although she may have suitable vascular access for a transfemoral approach.  I suspect that palliative medical therapy would be the best choice under the circumstances, despite the poor prognosis associated with her aortic valve disease.   Plan:  The patient was seen in conjunction with Dr. Excell Seltzer.  We reviewed options at length with the patient, her husband and her son.  The patient displayed an extremely limited capacity to understand anything discussed.  However, she repeatedly expressed comfort with knowledge that her prognosis is poor and that she might not live much longer, and she made it quite clear that she did not wish to have any invasive treatments for her underlying medical conditions.  Under the circumstances, I cannot recommend an aggressive approach to her care.  Her husband asked numerous questions, some of which were repeated several times.  He seems to understand his wife's condition, although he quickly dismissed his wife's voiced opinions regarding her care.  It seems clear that he remains uncertain what should be done.  The concept of palliative care seems to be difficult for him to cope with at this time.  He plans to discuss matters further with his family, and he may call again if further consultation is desired.  All of his questions were answered.   I spent in excess of 60 minutes during the conduct of this office consultation and >50% of  this time involved direct face-to-face encounter with the patient for counseling and/or coordination of their care.  Salvatore Decent. Cornelius Moras, MD 01/23/2014 7:27 PM

## 2014-01-28 ENCOUNTER — Telehealth: Payer: Self-pay

## 2014-01-28 NOTE — Telephone Encounter (Signed)
Per staff message, "Per Dr. Excell Seltzer, this patient has elected not to pursue TAVR and will just need regular f/u in 4 weeks with Dr. Mariah Milling."  Left message for pt to call back to sched appt w/ Dr. Mariah Milling.

## 2014-01-28 NOTE — Telephone Encounter (Signed)
Spoke w/ pt's husband.  Pt sched to see Dr. Mariah Milling 02/27/14 @ 2:30.

## 2014-01-30 ENCOUNTER — Telehealth: Payer: Self-pay | Admitting: *Deleted

## 2014-01-30 NOTE — Telephone Encounter (Signed)
Spoke w/ Kandee Keen.  He did not realize that pt had not been seen in our office yet. He was looking for Dr. Windell Hummingbird opinion on whether pt would be a good candidate for Hospice. Advised him that pt is sched to be seen by [place] next month.

## 2014-01-30 NOTE — Telephone Encounter (Signed)
Please call (lengthy conversation per [facility])

## 2014-02-15 ENCOUNTER — Inpatient Hospital Stay: Payer: Self-pay | Admitting: Student

## 2014-02-15 LAB — COMPREHENSIVE METABOLIC PANEL
ALBUMIN: 3.6 g/dL (ref 3.4–5.0)
ALK PHOS: 101 U/L
ANION GAP: 6 — AB (ref 7–16)
BILIRUBIN TOTAL: 0.7 mg/dL (ref 0.2–1.0)
BUN: 15 mg/dL (ref 7–18)
CHLORIDE: 102 mmol/L (ref 98–107)
CO2: 28 mmol/L (ref 21–32)
Calcium, Total: 9.5 mg/dL (ref 8.5–10.1)
Creatinine: 0.49 mg/dL — ABNORMAL LOW (ref 0.60–1.30)
Glucose: 98 mg/dL (ref 65–99)
OSMOLALITY: 273 (ref 275–301)
Potassium: 4.6 mmol/L (ref 3.5–5.1)
SGOT(AST): 44 U/L — ABNORMAL HIGH (ref 15–37)
SGPT (ALT): 23 U/L (ref 12–78)
SODIUM: 136 mmol/L (ref 136–145)
Total Protein: 7.2 g/dL (ref 6.4–8.2)

## 2014-02-15 LAB — TROPONIN I

## 2014-02-15 LAB — URINALYSIS, COMPLETE
BILIRUBIN, UR: NEGATIVE
Blood: NEGATIVE
GLUCOSE, UR: NEGATIVE mg/dL (ref 0–75)
Ketone: NEGATIVE
NITRITE: NEGATIVE
PH: 6 (ref 4.5–8.0)
Protein: NEGATIVE
SPECIFIC GRAVITY: 1.014 (ref 1.003–1.030)
Squamous Epithelial: 4
Transitional Epi: <1
WBC UR: 18 /HPF (ref 0–5)

## 2014-02-15 LAB — CK TOTAL AND CKMB (NOT AT ARMC)
CK, Total: 69 U/L
CK-MB: <0.5 ng/mL — ABNORMAL LOW (ref 0.5–3.6)

## 2014-02-15 LAB — CBC
HCT: 49.1 % — AB (ref 35.0–47.0)
HGB: 15.8 g/dL (ref 12.0–16.0)
MCH: 28.5 pg (ref 26.0–34.0)
MCHC: 32.2 g/dL (ref 32.0–36.0)
MCV: 89 fL (ref 80–100)
Platelet: 203 10*3/uL (ref 150–440)
RBC: 5.55 10*6/uL — ABNORMAL HIGH (ref 3.80–5.20)
RDW: 13.9 % (ref 11.5–14.5)
WBC: 14.3 10*3/uL — ABNORMAL HIGH (ref 3.6–11.0)

## 2014-02-16 LAB — CBC WITH DIFFERENTIAL/PLATELET
Basophil #: 0 10*3/uL (ref 0.0–0.1)
Basophil %: 0.3 %
EOS PCT: 0.2 %
Eosinophil #: 0 10*3/uL (ref 0.0–0.7)
HCT: 47 % (ref 35.0–47.0)
HGB: 15.7 g/dL (ref 12.0–16.0)
Lymphocyte #: 0.2 10*3/uL — ABNORMAL LOW (ref 1.0–3.6)
Lymphocyte %: 1.4 %
MCH: 29.2 pg (ref 26.0–34.0)
MCHC: 33.3 g/dL (ref 32.0–36.0)
MCV: 88 fL (ref 80–100)
MONOS PCT: 3.9 %
Monocyte #: 0.4 x10 3/mm (ref 0.2–0.9)
Neutrophil #: 10.7 10*3/uL — ABNORMAL HIGH (ref 1.4–6.5)
Neutrophil %: 94.2 %
Platelet: 187 10*3/uL (ref 150–440)
RBC: 5.38 10*6/uL — AB (ref 3.80–5.20)
RDW: 14.3 % (ref 11.5–14.5)
WBC: 11.4 10*3/uL — AB (ref 3.6–11.0)

## 2014-02-16 LAB — BASIC METABOLIC PANEL
Anion Gap: 7 (ref 7–16)
BUN: 14 mg/dL (ref 7–18)
CALCIUM: 9.4 mg/dL (ref 8.5–10.1)
CO2: 27 mmol/L (ref 21–32)
Chloride: 101 mmol/L (ref 98–107)
Creatinine: 0.71 mg/dL (ref 0.60–1.30)
EGFR (African American): >60
GLUCOSE: 130 mg/dL — AB (ref 65–99)
OSMOLALITY: 272 (ref 275–301)
POTASSIUM: 3.7 mmol/L (ref 3.5–5.1)
Sodium: 135 mmol/L — ABNORMAL LOW (ref 136–145)

## 2014-02-20 LAB — CULTURE, BLOOD (SINGLE)

## 2014-02-27 ENCOUNTER — Ambulatory Visit: Payer: Medicare Other | Admitting: Cardiovascular Disease

## 2014-05-21 ENCOUNTER — Emergency Department: Payer: Self-pay | Admitting: Emergency Medicine

## 2014-05-21 LAB — CBC
HCT: 48 % — ABNORMAL HIGH (ref 35.0–47.0)
HGB: 15.5 g/dL (ref 12.0–16.0)
MCH: 28.8 pg (ref 26.0–34.0)
MCHC: 32.2 g/dL (ref 32.0–36.0)
MCV: 90 fL (ref 80–100)
PLATELETS: 192 10*3/uL (ref 150–440)
RBC: 5.36 10*6/uL — ABNORMAL HIGH (ref 3.80–5.20)
RDW: 14.6 % — ABNORMAL HIGH (ref 11.5–14.5)
WBC: 7.6 10*3/uL (ref 3.6–11.0)

## 2014-05-21 LAB — URINALYSIS, COMPLETE
Bilirubin,UR: NEGATIVE
Blood: NEGATIVE
Glucose,UR: NEGATIVE mg/dL (ref 0–75)
Ketone: NEGATIVE
Nitrite: POSITIVE
PH: 7 (ref 4.5–8.0)
Protein: 30
RBC,UR: 3 /HPF (ref 0–5)
SPECIFIC GRAVITY: 1.012 (ref 1.003–1.030)
Squamous Epithelial: NONE SEEN
WBC UR: 1390 /HPF (ref 0–5)

## 2014-05-21 LAB — COMPREHENSIVE METABOLIC PANEL
ALBUMIN: 3.6 g/dL (ref 3.4–5.0)
ALK PHOS: 95 U/L
ALT: 23 U/L (ref 12–78)
ANION GAP: 7 (ref 7–16)
BUN: 16 mg/dL (ref 7–18)
Bilirubin,Total: 0.5 mg/dL (ref 0.2–1.0)
CALCIUM: 9.6 mg/dL (ref 8.5–10.1)
CO2: 29 mmol/L (ref 21–32)
Chloride: 105 mmol/L (ref 98–107)
Creatinine: 0.89 mg/dL (ref 0.60–1.30)
EGFR (African American): >60
EGFR (Non-African Amer.): >60
Glucose: 113 mg/dL — ABNORMAL HIGH (ref 65–99)
OSMOLALITY: 283 (ref 275–301)
Potassium: 3.4 mmol/L — ABNORMAL LOW (ref 3.5–5.1)
SGOT(AST): 21 U/L (ref 15–37)
Sodium: 141 mmol/L (ref 136–145)
Total Protein: 8.2 g/dL (ref 6.4–8.2)

## 2014-05-21 LAB — TROPONIN I: Troponin-I: <0.02 ng/mL

## 2014-05-22 ENCOUNTER — Inpatient Hospital Stay: Payer: Self-pay | Admitting: Internal Medicine

## 2014-05-22 DIAGNOSIS — A419 Sepsis, unspecified organism: Secondary | ICD-10-CM

## 2014-05-22 DIAGNOSIS — R748 Abnormal levels of other serum enzymes: Secondary | ICD-10-CM

## 2014-05-22 DIAGNOSIS — I359 Nonrheumatic aortic valve disorder, unspecified: Secondary | ICD-10-CM

## 2014-05-22 DIAGNOSIS — N3000 Acute cystitis without hematuria: Secondary | ICD-10-CM

## 2014-05-22 LAB — CK-MB
CK-MB: 4.8 ng/mL — ABNORMAL HIGH (ref 0.5–3.6)
CK-MB: 37.1 ng/mL — ABNORMAL HIGH (ref 0.5–3.6)
CK-MB: 53.2 ng/mL — ABNORMAL HIGH (ref 0.5–3.6)

## 2014-05-22 LAB — TROPONIN I
TROPONIN-I: 0.87 ng/mL — AB
TROPONIN-I: 27 ng/mL — AB
Troponin-I: 10 ng/mL — ABNORMAL HIGH

## 2014-05-23 LAB — CBC WITH DIFFERENTIAL/PLATELET
BASOS ABS: 0 10*3/uL (ref 0.0–0.1)
Basophil %: 0.4 %
EOS PCT: 2.2 %
Eosinophil #: 0.2 10*3/uL (ref 0.0–0.7)
HCT: 46.9 % (ref 35.0–47.0)
HGB: 15.1 g/dL (ref 12.0–16.0)
LYMPHS ABS: 0.4 10*3/uL — AB (ref 1.0–3.6)
Lymphocyte %: 3.2 %
MCH: 29 pg (ref 26.0–34.0)
MCHC: 32.2 g/dL (ref 32.0–36.0)
MCV: 90 fL (ref 80–100)
Monocyte #: 0.6 x10 3/mm (ref 0.2–0.9)
Monocyte %: 5.5 %
NEUTROS PCT: 88.7 %
Neutrophil #: 9.8 10*3/uL — ABNORMAL HIGH (ref 1.4–6.5)
Platelet: 167 10*3/uL (ref 150–440)
RBC: 5.21 10*6/uL — ABNORMAL HIGH (ref 3.80–5.20)
RDW: 14.7 % — ABNORMAL HIGH (ref 11.5–14.5)
WBC: 11 10*3/uL (ref 3.6–11.0)

## 2014-05-23 LAB — BASIC METABOLIC PANEL
Anion Gap: 9 (ref 7–16)
BUN: 13 mg/dL (ref 7–18)
CALCIUM: 8.8 mg/dL (ref 8.5–10.1)
CHLORIDE: 109 mmol/L — AB (ref 98–107)
CO2: 23 mmol/L (ref 21–32)
CREATININE: 0.76 mg/dL (ref 0.60–1.30)
Glucose: 106 mg/dL — ABNORMAL HIGH (ref 65–99)
Osmolality: 282 (ref 275–301)
Potassium: 3.2 mmol/L — ABNORMAL LOW (ref 3.5–5.1)
SODIUM: 141 mmol/L (ref 136–145)

## 2014-05-23 LAB — URINE CULTURE

## 2014-05-23 LAB — MAGNESIUM: Magnesium: 1.6 mg/dL — ABNORMAL LOW

## 2014-05-23 LAB — CK-MB: CK-MB: 43.4 ng/mL — ABNORMAL HIGH (ref 0.5–3.6)

## 2014-05-24 LAB — URINE CULTURE

## 2014-05-28 DIAGNOSIS — I5022 Chronic systolic (congestive) heart failure: Secondary | ICD-10-CM

## 2014-05-28 DIAGNOSIS — F0280 Dementia in other diseases classified elsewhere without behavioral disturbance: Secondary | ICD-10-CM

## 2014-05-28 DIAGNOSIS — F3289 Other specified depressive episodes: Secondary | ICD-10-CM

## 2014-05-28 DIAGNOSIS — G309 Alzheimer's disease, unspecified: Secondary | ICD-10-CM

## 2014-05-28 DIAGNOSIS — I219 Acute myocardial infarction, unspecified: Secondary | ICD-10-CM

## 2014-05-28 DIAGNOSIS — F329 Major depressive disorder, single episode, unspecified: Secondary | ICD-10-CM

## 2014-05-28 DIAGNOSIS — I359 Nonrheumatic aortic valve disorder, unspecified: Secondary | ICD-10-CM

## 2014-05-28 DIAGNOSIS — M159 Polyosteoarthritis, unspecified: Secondary | ICD-10-CM

## 2014-06-06 ENCOUNTER — Inpatient Hospital Stay: Payer: Self-pay | Admitting: Specialist

## 2014-06-06 ENCOUNTER — Ambulatory Visit: Payer: Self-pay | Admitting: Internal Medicine

## 2014-06-06 LAB — CBC WITH DIFFERENTIAL/PLATELET
BASOS PCT: 0.6 %
Basophil #: 0.1 10*3/uL (ref 0.0–0.1)
Eosinophil #: 0 10*3/uL (ref 0.0–0.7)
Eosinophil %: 0.2 %
HCT: 45.1 % (ref 35.0–47.0)
HGB: 14.3 g/dL (ref 12.0–16.0)
LYMPHS PCT: 9.9 %
Lymphocyte #: 1.2 10*3/uL (ref 1.0–3.6)
MCH: 29 pg (ref 26.0–34.0)
MCHC: 31.8 g/dL — ABNORMAL LOW (ref 32.0–36.0)
MCV: 91 fL (ref 80–100)
Monocyte #: 0.7 x10 3/mm (ref 0.2–0.9)
Monocyte %: 5.4 %
Neutrophil #: 10.1 10*3/uL — ABNORMAL HIGH (ref 1.4–6.5)
Neutrophil %: 83.9 %
Platelet: 275 10*3/uL (ref 150–440)
RBC: 4.95 10*6/uL (ref 3.80–5.20)
RDW: 14.9 % — ABNORMAL HIGH (ref 11.5–14.5)
WBC: 12.1 10*3/uL — AB (ref 3.6–11.0)

## 2014-06-06 LAB — TROPONIN I
TROPONIN-I: 0.1 ng/mL — AB
Troponin-I: 0.11 ng/mL — ABNORMAL HIGH
Troponin-I: 0.1 ng/mL — ABNORMAL HIGH

## 2014-06-06 LAB — URINALYSIS, COMPLETE
BILIRUBIN, UR: NEGATIVE
GLUCOSE, UR: NEGATIVE mg/dL (ref 0–75)
KETONE: NEGATIVE
Leukocyte Esterase: NEGATIVE
Nitrite: NEGATIVE
PH: 5 (ref 4.5–8.0)
SPECIFIC GRAVITY: 1.023 (ref 1.003–1.030)
Squamous Epithelial: 1
WBC UR: NONE SEEN /HPF (ref 0–5)

## 2014-06-06 LAB — COMPREHENSIVE METABOLIC PANEL
ALBUMIN: 2.9 g/dL — AB (ref 3.4–5.0)
ALK PHOS: 116 U/L
AST: 25 U/L (ref 15–37)
Anion Gap: 9 (ref 7–16)
BILIRUBIN TOTAL: 1.5 mg/dL — AB (ref 0.2–1.0)
BUN: 20 mg/dL — ABNORMAL HIGH (ref 7–18)
CALCIUM: 9.2 mg/dL (ref 8.5–10.1)
Chloride: 108 mmol/L — ABNORMAL HIGH (ref 98–107)
Co2: 24 mmol/L (ref 21–32)
Creatinine: 1.18 mg/dL (ref 0.60–1.30)
EGFR (African American): 50 — ABNORMAL LOW
EGFR (Non-African Amer.): 44 — ABNORMAL LOW
Glucose: 138 mg/dL — ABNORMAL HIGH (ref 65–99)
Osmolality: 286 (ref 275–301)
POTASSIUM: 3.9 mmol/L (ref 3.5–5.1)
SGPT (ALT): 24 U/L
SODIUM: 141 mmol/L (ref 136–145)
TOTAL PROTEIN: 7 g/dL (ref 6.4–8.2)

## 2014-06-06 LAB — PHOSPHORUS: PHOSPHORUS: 3.8 mg/dL (ref 2.5–4.9)

## 2014-06-06 LAB — PRO B NATRIURETIC PEPTIDE: B-Type Natriuretic Peptide: 65272 pg/mL — ABNORMAL HIGH (ref 0–450)

## 2014-06-06 LAB — PROTIME-INR
INR: 1.2
Prothrombin Time: 14.9 secs — ABNORMAL HIGH (ref 11.5–14.7)

## 2014-06-06 LAB — MAGNESIUM: MAGNESIUM: 1.7 mg/dL — AB

## 2014-06-07 LAB — CBC WITH DIFFERENTIAL/PLATELET
BASOS ABS: 0.1 10*3/uL (ref 0.0–0.1)
Basophil %: 0.8 %
EOS PCT: 0.8 %
Eosinophil #: 0.1 10*3/uL (ref 0.0–0.7)
HCT: 44.4 % (ref 35.0–47.0)
HGB: 14.2 g/dL (ref 12.0–16.0)
LYMPHS ABS: 1.3 10*3/uL (ref 1.0–3.6)
LYMPHS PCT: 8.1 %
MCH: 29 pg (ref 26.0–34.0)
MCHC: 32.1 g/dL (ref 32.0–36.0)
MCV: 90 fL (ref 80–100)
MONO ABS: 1 x10 3/mm — AB (ref 0.2–0.9)
MONOS PCT: 5.9 %
NEUTROS ABS: 13.5 10*3/uL — AB (ref 1.4–6.5)
NEUTROS PCT: 84.4 %
Platelet: 266 10*3/uL (ref 150–440)
RBC: 4.91 10*6/uL (ref 3.80–5.20)
RDW: 15 % — ABNORMAL HIGH (ref 11.5–14.5)
WBC: 16 10*3/uL — ABNORMAL HIGH (ref 3.6–11.0)

## 2014-06-07 LAB — BASIC METABOLIC PANEL
Anion Gap: 8 (ref 7–16)
BUN: 21 mg/dL — ABNORMAL HIGH (ref 7–18)
CALCIUM: 9.2 mg/dL (ref 8.5–10.1)
CO2: 29 mmol/L (ref 21–32)
CREATININE: 1.01 mg/dL (ref 0.60–1.30)
Chloride: 105 mmol/L (ref 98–107)
EGFR (African American): >60
EGFR (Non-African Amer.): 53 — ABNORMAL LOW
Glucose: 109 mg/dL — ABNORMAL HIGH (ref 65–99)
OSMOLALITY: 287 (ref 275–301)
Potassium: 3.4 mmol/L — ABNORMAL LOW (ref 3.5–5.1)
Sodium: 142 mmol/L (ref 136–145)

## 2014-06-08 DIAGNOSIS — I359 Nonrheumatic aortic valve disorder, unspecified: Secondary | ICD-10-CM

## 2014-06-08 DIAGNOSIS — I5020 Unspecified systolic (congestive) heart failure: Secondary | ICD-10-CM

## 2014-06-08 DIAGNOSIS — I509 Heart failure, unspecified: Secondary | ICD-10-CM

## 2014-06-08 LAB — CBC WITH DIFFERENTIAL/PLATELET
Basophil #: 0.1 10*3/uL (ref 0.0–0.1)
Basophil %: 0.7 %
EOS PCT: 1.9 %
Eosinophil #: 0.2 10*3/uL (ref 0.0–0.7)
HCT: 49.6 % — ABNORMAL HIGH (ref 35.0–47.0)
HGB: 15.8 g/dL (ref 12.0–16.0)
Lymphocyte #: 1.3 10*3/uL (ref 1.0–3.6)
Lymphocyte %: 13.6 %
MCH: 28.8 pg (ref 26.0–34.0)
MCHC: 31.9 g/dL — ABNORMAL LOW (ref 32.0–36.0)
MCV: 91 fL (ref 80–100)
MONOS PCT: 6 %
Monocyte #: 0.6 x10 3/mm (ref 0.2–0.9)
Neutrophil #: 7.3 10*3/uL — ABNORMAL HIGH (ref 1.4–6.5)
Neutrophil %: 77.8 %
Platelet: 281 10*3/uL (ref 150–440)
RBC: 5.49 10*6/uL — ABNORMAL HIGH (ref 3.80–5.20)
RDW: 15.1 % — ABNORMAL HIGH (ref 11.5–14.5)
WBC: 9.4 10*3/uL (ref 3.6–11.0)

## 2014-06-09 ENCOUNTER — Telehealth: Payer: Self-pay

## 2014-06-09 LAB — CBC WITH DIFFERENTIAL/PLATELET
BASOS ABS: 0.1 10*3/uL (ref 0.0–0.1)
Basophil %: 0.8 %
EOS PCT: 2.2 %
Eosinophil #: 0.2 10*3/uL (ref 0.0–0.7)
HCT: 49.2 % — ABNORMAL HIGH (ref 35.0–47.0)
HGB: 15.8 g/dL (ref 12.0–16.0)
LYMPHS PCT: 12.1 %
Lymphocyte #: 1.2 10*3/uL (ref 1.0–3.6)
MCH: 29 pg (ref 26.0–34.0)
MCHC: 32.1 g/dL (ref 32.0–36.0)
MCV: 90 fL (ref 80–100)
MONOS PCT: 8.9 %
Monocyte #: 0.9 x10 3/mm (ref 0.2–0.9)
NEUTROS ABS: 7.4 10*3/uL — AB (ref 1.4–6.5)
NEUTROS PCT: 76 %
Platelet: 276 10*3/uL (ref 150–440)
RBC: 5.45 10*6/uL — AB (ref 3.80–5.20)
RDW: 14.9 % — AB (ref 11.5–14.5)
WBC: 9.7 10*3/uL (ref 3.6–11.0)

## 2014-06-09 LAB — BASIC METABOLIC PANEL
ANION GAP: 10 (ref 7–16)
BUN: 21 mg/dL — AB (ref 7–18)
Calcium, Total: 9.9 mg/dL (ref 8.5–10.1)
Chloride: 96 mmol/L — ABNORMAL LOW (ref 98–107)
Co2: 31 mmol/L (ref 21–32)
Creatinine: 0.75 mg/dL (ref 0.60–1.30)
GLUCOSE: 112 mg/dL — AB (ref 65–99)
Osmolality: 278 (ref 275–301)
Potassium: 3.1 mmol/L — ABNORMAL LOW (ref 3.5–5.1)
Sodium: 137 mmol/L (ref 136–145)

## 2014-06-09 NOTE — Telephone Encounter (Signed)
Attempted to contact pt regarding discharge from [facility] on 06/09/14. No answer, no machine.

## 2014-06-10 DIAGNOSIS — I248 Other forms of acute ischemic heart disease: Secondary | ICD-10-CM

## 2014-06-10 DIAGNOSIS — I359 Nonrheumatic aortic valve disorder, unspecified: Secondary | ICD-10-CM

## 2014-06-10 DIAGNOSIS — I509 Heart failure, unspecified: Secondary | ICD-10-CM

## 2014-06-10 NOTE — Telephone Encounter (Signed)
2nd attempt to contact pt regarding discharge from [facility] on 06/09/14. No answer, no machine.

## 2014-06-11 LAB — CULTURE, BLOOD (SINGLE)

## 2014-06-11 NOTE — Telephone Encounter (Signed)
3rd attempt to contact pt regarding discharge from [facility] on 06/09/14.  No answer, no machine.

## 2014-06-15 ENCOUNTER — Encounter: Payer: Self-pay | Admitting: *Deleted

## 2014-06-16 ENCOUNTER — Encounter: Payer: Medicare Other | Admitting: Nurse Practitioner

## 2014-06-16 ENCOUNTER — Encounter: Payer: Self-pay | Admitting: Nurse Practitioner

## 2014-06-16 ENCOUNTER — Encounter: Payer: Self-pay | Admitting: *Deleted

## 2014-06-25 DIAGNOSIS — F329 Major depressive disorder, single episode, unspecified: Secondary | ICD-10-CM

## 2014-06-25 DIAGNOSIS — F0280 Dementia in other diseases classified elsewhere without behavioral disturbance: Secondary | ICD-10-CM

## 2014-06-25 DIAGNOSIS — F3289 Other specified depressive episodes: Secondary | ICD-10-CM

## 2014-06-25 DIAGNOSIS — I5022 Chronic systolic (congestive) heart failure: Secondary | ICD-10-CM

## 2014-06-25 DIAGNOSIS — I359 Nonrheumatic aortic valve disorder, unspecified: Secondary | ICD-10-CM

## 2014-06-25 DIAGNOSIS — M159 Polyosteoarthritis, unspecified: Secondary | ICD-10-CM

## 2014-06-25 DIAGNOSIS — I209 Angina pectoris, unspecified: Secondary | ICD-10-CM

## 2014-06-25 DIAGNOSIS — G309 Alzheimer's disease, unspecified: Secondary | ICD-10-CM

## 2014-07-06 ENCOUNTER — Ambulatory Visit (INDEPENDENT_AMBULATORY_CARE_PROVIDER_SITE_OTHER): Payer: Medicare Other | Admitting: Cardiovascular Disease

## 2014-07-06 ENCOUNTER — Encounter: Payer: Self-pay | Admitting: Cardiovascular Disease

## 2014-07-06 ENCOUNTER — Encounter (INDEPENDENT_AMBULATORY_CARE_PROVIDER_SITE_OTHER): Payer: Self-pay

## 2014-07-06 VITALS — BP 100/57 | HR 59 | Ht 60.0 in | Wt 103.5 lb

## 2014-07-06 DIAGNOSIS — I359 Nonrheumatic aortic valve disorder, unspecified: Secondary | ICD-10-CM

## 2014-07-06 DIAGNOSIS — I2510 Atherosclerotic heart disease of native coronary artery without angina pectoris: Secondary | ICD-10-CM

## 2014-07-06 DIAGNOSIS — I350 Nonrheumatic aortic (valve) stenosis: Secondary | ICD-10-CM

## 2014-07-06 DIAGNOSIS — I10 Essential (primary) hypertension: Secondary | ICD-10-CM

## 2014-07-06 DIAGNOSIS — I5022 Chronic systolic (congestive) heart failure: Secondary | ICD-10-CM

## 2014-07-06 MED ORDER — METOPROLOL SUCCINATE ER 50 MG PO TB24: 50.0000 mg | ORAL_TABLET | Freq: Every day | ORAL | Status: AC

## 2014-07-06 MED ORDER — LOSARTAN POTASSIUM 25 MG PO TABS: 25.0000 mg | ORAL_TABLET | Freq: Every day | ORAL | Status: DC

## 2014-07-06 NOTE — Assessment & Plan Note (Signed)
Continue medical therapy 

## 2014-07-06 NOTE — Patient Instructions (Addendum)
Your physician has recommended you make the following change in your medication:  Decrease Toprol to 50 mg once daily  Decrease Losartan to 25 mg once daily   Your physician recommends that you schedule a follow-up appointment in:  1 month

## 2014-07-06 NOTE — Assessment & Plan Note (Signed)
The patient appears to be mildly fluid overloaded. However, given low blood pressure, I elected not to increase the dose of Lasix. I will reevaluate in one month. Overall prognosis is very poor and the patient should be under hospice care if not already.

## 2014-07-06 NOTE — Progress Notes (Signed)
HPI  This is an 80 year old woman who is here today for a followup visit regarding coronary artery disease, chronic systolic heart failure and critical aortic stenosis.  The patient has a history of coronary artery disease status post remote CABG in the 1990s. She also has a history of supraventricular tachycardia and has undergone radiofrequency ablation at [facility].  However, she was hospitalized in February with a urinary tract infection. She was found to have elevated troponin levels and underwent echocardiography demonstrating severe aortic stenosis with LV dysfunction. Cardiac catheterization was performed and this demonstrated severe native coronary artery disease with continued patency of all bypass grafts. She was transferred to [facility] where she underwent evaluation by the heart valve team. She was deemed to be not a good candidate for TAVR DUE to dementia. She was hospitalized in early are with an acute on chronic heart failure and small non-ST elevation myocardial infarction. She improved with diuresis and was discharged to [facility].  Most recent echo in February of 2015 showed an ejection fraction of 40% with critical aortic stenosis and valve area of 0.25. The patient has severe dementia and cannot provide history. According to the husband, she has been having low blood pressure readings. The patient has no complaints.   Allergies  Allergen Reactions  . Penicillins Other (See Comments)    Unknown  . Isosorbide Rash     Current Outpatient Prescriptions on File Prior to Visit  Medication Sig Dispense Refill  . aspirin 81 MG tablet Take 81 mg by mouth daily.      . citalopram (CELEXA) 20 MG tablet Take 20 mg by mouth daily.       No current facility-administered medications on file prior to visit.     Past Medical History  Diagnosis Date  . Critical aortic valve stenosis     a. Relatively asymptomatic until 12/2013 - not a candidate for TAVR 2/2 dementia.  Marland Kitchen  CAD (coronary artery disease)     a. s/p CABG 03/1997 at [facility] -  including LIMA to LAD, LRA to OM, SVG to RCA with open vein harvest left thigh.  . HTN (hypertension)   . Hyperlipidemia   . SVT (supraventricular tachycardia)     a. s/p ablation [facility].  . Polio     childhood  . Urinary incontinence   . Fecal impaction 12/24/2013  . History of UTI 12/24/2013    >100,000 col/mL Hafnia alvei  . Heart murmur   . History of blood transfusion 1998    "pretty sure she had to have blood when she had bypass surgery  . DJD (degenerative joint disease)   . Osteoarthritis   . Arthritis     "pretty bad"  . Depression     "related to son on combat duty in [place]"   . Moderate dementia     "at times it's pretty bad" (12/31/2013)  . Ischemic cardiomyopathy     a. 12/2013 Echo: EF 20-25%, diff HK, mild AI/Sev AS, mild MR, mod TR.  Marland Kitchen Chronic systolic CHF (congestive heart failure)     a. 12/2013 Echo: EF 20-25%     Past Surgical History  Procedure Laterality Date  . Abdominal hysterectomy    . Bunionectomy    . Ganglion cyst excision    . Cholecystectomy    . Coronary artery bypass graft  04/02/1997    CABG x3 at [facility] using LIMA to LAD, LRA to OM, SVG to RCA with open vein harvest left  thigh  . Total knee arthroplasty Left   . Total knee arthroplasty Right   . Atrial tach ablation      [facility]  . Ankle arthroplasty Right ~ 19138    "polio"  . Tonsillectomy    . Carpal tunnel release Bilateral     "I think she's had both sides" (12/31/2013)  . Cataract extraction w/ intraocular lens  implant, bilateral Bilateral   . Cardiac catheterization      "more than 1"   . Coronary angioplasty    . Cardiac catheterization  12/2013    [facility]     Family History  Problem Relation Age of Onset  . Heart attack Father   . Heart attack Brother   . Heart attack Mother      History   Social History  . Marital Status: Married    Spouse Name: N/A    Number of Children: N/A  . Years of Education: N/A    Occupational History  . Retired    Social History Main Topics  . Smoking status: Never Smoker   . Smokeless tobacco: Never Used  . Alcohol Use: No  . Drug Use: No  . Sexual Activity: No   Other Topics Concern  . Not on file   Social History Narrative  . No narrative on file      PHYSICAL EXAM   BP 100/57  Pulse 59  Ht 5' (1.524 m)  Wt 103 lb 8 oz (46.947 kg)  BMI 20.21 kg/m2 Constitutional: She is oriented to person, place, and time. She appears well-developed and well-nourished. No distress.  HENT: No nasal discharge.  Head: Normocephalic and atraumatic.  Eyes: Pupils are equal and round. No discharge.  Neck: Normal range of motion. Neck supple. Mild JVD present. No thyromegaly present.  Cardiovascular: Normal rate, regular rhythm, normal heart sounds. Exam reveals no gallop and no friction rub. There is a 4/6 systolic ejection murmur at the aortic area which is late peaking with absent S2  Pulmonary/Chest: Effort normal and breath sounds normal. No stridor. No respiratory distress. She has no wheezes. She has some bibasilar crackles  Abdominal: Soft. Bowel sounds are normal. She exhibits no distension. There is no tenderness. There is no rebound and no guarding.  Musculoskeletal: Normal range of motion. She exhibits no edema and no tenderness.  Neurological: She is alert and oriented to person, place, and time. Coordination normal.  Skin: Skin is warm and dry. No rash noted. She is not diaphoretic. No erythema. No pallor.  Psychiatric: She has a normal mood and affect. Her behavior is normal. Judgment and thought content normal.     SWF:UXNAT  Bradycardia  -Right atrial enlargement.   Voltage criteria for LVH  (R(I)+S(III) exceeds 2.50 mV).   -Nonspecific ST depression   +   T-abnormality  -Seen with left ventricular hypertrophy (strain) or digitalis effect  consider  Anterolateral ischemia.   ABNORMAL     ASSESSMENT AND PLAN

## 2014-07-06 NOTE — Assessment & Plan Note (Signed)
The patient has critical aortic stenosis and she has been deemed not a candidate for any intervention given her advanced dementia. We should allow her blood pressure to run higher than normal in order to improve coronary perfusion in the setting of critical aortic stenosis. Thus, I decreased the dose of Toprol and losartan by half.

## 2014-07-07 ENCOUNTER — Ambulatory Visit: Payer: Self-pay | Admitting: Internal Medicine

## 2014-07-24 DIAGNOSIS — I509 Heart failure, unspecified: Secondary | ICD-10-CM

## 2014-07-24 DIAGNOSIS — F0280 Dementia in other diseases classified elsewhere without behavioral disturbance: Secondary | ICD-10-CM

## 2014-07-24 DIAGNOSIS — F329 Major depressive disorder, single episode, unspecified: Secondary | ICD-10-CM

## 2014-07-24 DIAGNOSIS — IMO0002 Reserved for concepts with insufficient information to code with codable children: Secondary | ICD-10-CM

## 2014-07-24 DIAGNOSIS — M1710 Unilateral primary osteoarthritis, unspecified knee: Secondary | ICD-10-CM

## 2014-07-24 DIAGNOSIS — I208 Other forms of angina pectoris: Secondary | ICD-10-CM

## 2014-07-24 DIAGNOSIS — G309 Alzheimer's disease, unspecified: Secondary | ICD-10-CM

## 2014-07-24 DIAGNOSIS — I359 Nonrheumatic aortic valve disorder, unspecified: Secondary | ICD-10-CM

## 2014-07-24 DIAGNOSIS — F3289 Other specified depressive episodes: Secondary | ICD-10-CM

## 2014-07-31 DIAGNOSIS — F430 Acute stress reaction: Secondary | ICD-10-CM

## 2014-08-06 ENCOUNTER — Ambulatory Visit: Payer: Medicare Other | Admitting: Cardiovascular Disease

## 2014-08-18 ENCOUNTER — Encounter: Payer: Self-pay | Admitting: Cardiovascular Disease

## 2014-08-18 ENCOUNTER — Ambulatory Visit (INDEPENDENT_AMBULATORY_CARE_PROVIDER_SITE_OTHER): Payer: Medicare Other | Admitting: Cardiovascular Disease

## 2014-08-18 VITALS — BP 102/70 | HR 60 | Ht 60.0 in | Wt 103.0 lb

## 2014-08-18 DIAGNOSIS — I350 Nonrheumatic aortic (valve) stenosis: Secondary | ICD-10-CM

## 2014-08-18 DIAGNOSIS — I2510 Atherosclerotic heart disease of native coronary artery without angina pectoris: Secondary | ICD-10-CM

## 2014-08-18 DIAGNOSIS — I5022 Chronic systolic (congestive) heart failure: Secondary | ICD-10-CM

## 2014-08-18 NOTE — Assessment & Plan Note (Signed)
The patient is not able to provide history due to dementia but she appears to be clinically stable.

## 2014-08-18 NOTE — Assessment & Plan Note (Signed)
Given critical aortic stenosis, I discontinued losartan due to a relatively low blood pressure and risk of decreased coronary perfusion. She appears to be euvolemic on current dose of Lasix.

## 2014-08-18 NOTE — Progress Notes (Signed)
HPI  This is an 80 year old woman who is here today for a followup visit regarding coronary artery disease, chronic systolic heart failure and critical aortic stenosis.  The patient has a history of coronary artery disease status post remote CABG in the 1990s. She also has a history of supraventricular tachycardia and has [facility] at [facility].  However, she was hospitalized in February with a urinary tract infection. She was found to have elevated troponin levels and underwent echocardiography demonstrating severe aortic stenosis with LV dysfunction. Cardiac catheterization was performed and this demonstrated severe native coronary artery disease with continued patency of all bypass grafts. She was transferred to [facility] where she underwent evaluation by the heart valve team. She was deemed to be not a good candidate for TAVR due to dementia.  Most recent echo in February of 2015 showed an ejection fraction of 40% with critical aortic stenosis and valve area of 0.25. The patient has severe dementia and cannot provide history. I saw her last month for low blood pressure. I decreased the dose of losartan and Toprol. There has been improvement since then.   Allergies  Allergen Reactions  . Penicillins Other (See Comments)    Unknown  . Isosorbide Rash     Current Outpatient Prescriptions on File Prior to Visit  Medication Sig Dispense Refill  . acetaminophen (TYLENOL) 325 MG tablet Take 650 mg by mouth as needed.      Marland Kitchen. acetaminophen (TYLENOL) 650 MG CR tablet Take 650 mg by mouth 3 (three) times daily.      Marland Kitchen. aspirin 81 MG tablet Take 81 mg by mouth daily.      Marland Kitchen. atorvastatin (LIPITOR) 20 MG tablet Take 20 mg by mouth daily.      . citalopram (CELEXA) 20 MG tablet Take 20 mg by mouth daily.      . furosemide (LASIX) 20 MG tablet Take 20 mg by mouth 2 (two) times daily.      Marland Kitchen. LORazepam (ATIVAN) 0.5 MG tablet Take 0.5 mg by mouth as needed for anxiety.        . metoprolol succinate (TOPROL-XL) 50 MG 24 hr tablet Take 1 tablet (50 mg total) by mouth daily. Take with or immediately following a meal.  90 tablet  3  . morphine (KADIAN) 20 MG 24 hr capsule Take 20 mg by mouth as needed for pain.      . nitroGLYCERIN (NITROSTAT) 0.4 MG SL tablet Place 0.4 mg under the tongue every 5 (five) minutes as needed for chest pain.      . NON FORMULARY Med Pass 240 ml qd      . potassium chloride SA (K-DUR,KLOR-CON) 20 MEQ tablet Take 20 mEq by mouth daily.      . ranitidine (ZANTAC) 150 MG tablet Take 150 mg by mouth as needed for heartburn.      . rivastigmine (EXELON) 9.5 mg/24hr Place 9.5 mg onto the skin daily.       No current facility-administered medications on file prior to visit.     Past Medical History  Diagnosis Date  . Critical aortic valve stenosis     a. Relatively asymptomatic until 12/2013 - not a candidate for TAVR 2/2 dementia.  Marland Kitchen. CAD (coronary artery disease)     a. s/p [facility]G [facility] -  including LIMA to LAD, LRA to OM, SVG to RCA with open vein harvest left thigh.  . HTN (hypertension)   . Hyperlipidemia   .  SVT (supraventricular tachycardia)     a. s/p ablation [facility].  . Polio     childhood  . Urinary incontinence   . Fecal impaction 12/24/2013  . History of UTI 12/24/2013    >100,000 col/mL Hafnia alvei  . Heart murmur   . History of blood transfusion 1998    "pretty sure she had to have blood when she had bypass surgery  . DJD (degenerative joint disease)   . Osteoarthritis   . Arthritis     "pretty bad"  . Depression     "related to son on combat duty in [place]"   . Moderate dementia     "at times it's pretty bad" (12/31/2013)  . Ischemic cardiomyopathy     a. 12/2013 Echo: EF 20-25%, diff HK, mild AI/Sev AS, mild MR, mod TR.  Marland Kitchen Chronic systolic CHF (congestive heart failure)     a. 12/2013 Echo: EF 20-25%     Past Surgical History  Procedure Laterality Date  . Abdominal hysterectomy    . Bunionectomy    .  Ganglion cyst excision    . Cholecystectomy    . Coronary artery bypass graft  04/02/1997    CABG x3 at [facility] using LIMA to LAD, LRA to OM, SVG to RCA with open vein harvest left thigh  . Total knee arthroplasty Left   . Total knee arthroplasty Right   . Atrial tach ablation      [facility]  . Ankle arthroplasty Right ~ 19138    "polio"  . Tonsillectomy    . Carpal tunnel release Bilateral     "I think she's had both sides" (12/31/2013)  . Cataract extraction w/ intraocular lens  implant, bilateral Bilateral   . Cardiac catheterization      "more than 1"   . Coronary angioplasty    . Cardiac catheterization  12/2013    [facility]     Family History  Problem Relation Age of Onset  . Heart attack Father   . Heart attack Brother   . Heart attack Mother      History   Social History  . Marital Status: Married    Spouse Name: N/A    Number of Children: N/A  . Years of Education: N/A   Occupational History  . Retired    Social History Main Topics  . Smoking status: Never Smoker   . Smokeless tobacco: Never Used  . Alcohol Use: No  . Drug Use: No  . Sexual Activity: No   Other Topics Concern  . Not on file   Social History Narrative  . No narrative on file      PHYSICAL EXAM   BP 102/70  Pulse 60  Ht 5' (1.524 m)  Wt 103 lb (46.72 kg)  BMI 20.12 kg/m2 Constitutional: She is oriented to person, place, and time. She appears well-developed and well-nourished. No distress.  HENT: No nasal discharge.  Head: Normocephalic and atraumatic.  Eyes: Pupils are equal and round. No discharge.  Neck: Normal range of motion. Neck supple. Mild JVD present. No thyromegaly present.  Cardiovascular: Normal rate, regular rhythm, normal heart sounds. Exam reveals no gallop and no friction rub. There is a 4/6 systolic ejection murmur at the aortic area which is late peaking with absent S2  Pulmonary/Chest: Effort normal and breath sounds normal. No stridor. No respiratory distress. She has  no wheezes. She has some bibasilar crackles  Abdominal: Soft. Bowel sounds are normal. She exhibits no distension. There is no tenderness.  There is no rebound and no guarding.  Musculoskeletal: Normal range of motion. She exhibits no edema and no tenderness.  Neurological: She is alert and oriented to person, place, and time. Coordination normal.  Skin: Skin is warm and dry. No rash noted. She is not diaphoretic. No erythema. No pallor.  Psychiatric: She has a normal mood and affect. Her behavior is normal. Judgment and thought content normal.       ASSESSMENT AND PLAN

## 2014-08-18 NOTE — Assessment & Plan Note (Signed)
No reported angina. Continue medical therapy.

## 2014-08-18 NOTE — Patient Instructions (Signed)
Your physician has recommended you make the following change in your medication:  Stop Losartan   Your physician recommends that you schedule a follow-up appointment in:  3 months   Your next appointment will be scheduled in our new office located at :  [facility]  [address]  [place], [place] [address]

## 2014-08-20 DIAGNOSIS — F331 Major depressive disorder, recurrent, moderate: Secondary | ICD-10-CM

## 2014-08-20 DIAGNOSIS — F600 Paranoid personality disorder: Secondary | ICD-10-CM

## 2014-08-20 DIAGNOSIS — G301 Alzheimer's disease with late onset: Secondary | ICD-10-CM

## 2014-08-20 DIAGNOSIS — I350 Nonrheumatic aortic (valve) stenosis: Secondary | ICD-10-CM

## 2014-08-20 DIAGNOSIS — I5022 Chronic systolic (congestive) heart failure: Secondary | ICD-10-CM

## 2014-10-23 DIAGNOSIS — I5020 Unspecified systolic (congestive) heart failure: Secondary | ICD-10-CM

## 2014-10-23 DIAGNOSIS — G309 Alzheimer's disease, unspecified: Secondary | ICD-10-CM

## 2014-10-23 DIAGNOSIS — I350 Nonrheumatic aortic (valve) stenosis: Secondary | ICD-10-CM

## 2014-10-23 DIAGNOSIS — M1990 Unspecified osteoarthritis, unspecified site: Secondary | ICD-10-CM

## 2014-10-23 DIAGNOSIS — F323 Major depressive disorder, single episode, severe with psychotic features: Secondary | ICD-10-CM

## 2014-10-23 DIAGNOSIS — F600 Paranoid personality disorder: Secondary | ICD-10-CM

## 2014-11-09 ENCOUNTER — Telehealth: Payer: Self-pay

## 2014-11-09 NOTE — Telephone Encounter (Signed)
PLEASE NOTE: All timestamps contained within this report are represented as Eastern Standard Time. CONFIDENTIALTY NOTICE: This fax transmission is intended only for the addressee. It contains information that is legally privileged, confidential or otherwise protected from use or disclosure. If you are not the intended recipient, you are strictly prohibited from reviewing, disclosing, copying using or disseminating any of this information or taking any action in reliance on or regarding this information. If you have received this fax in error, please notify us immediately by telephone so that we can arrange for its return to us. Phone: [phone], Toll-Free: [phone], Fax: [phone] Page: 1 of 2 Call Id: [id number] [facility] Night - Client TELEPHONE ADVICE RECORD [facility] Patient Name: [name] Gender: Female DOB: 10/14/1931 Age: 81 Y 2 M 5 D Return Phone Number: Address: City/State/Zip: [place] [address] Client [facility] Night - Client Client Site [facility] - Night Physician Letvak, Richard Contact Type Call Call Type Triage / Clinical Caller Name Esly Relationship To Patient Other Return Phone Number Unavailable Chief Complaint Muscle Jerks, Tics And Shudders Initial Comment Caller states she is Navia from [facility] CB# [phone]. Pt has cold symptoms and was given Robitussin. Has started having muscle twitching on her whole body. PreDisposition InappropriateToAsk Nurse Assessment Nurse: Henderson, RN, Georgeanne Date/Time (Eastern Time): 11/06/2014 5:41:57 PM Confirm and document reason for call. If symptomatic, describe symptoms. ---pt is having muscle twitching over entire body. given banana and orange juice. she is on lasix. bp 126/64, hr- 72, . given robitussin yest with no effect and today was given robitussin and started twithcing. pt is on lasix 20 mg  bid, potassium 20 meq , topril xl 50 meq, celexa, tylenol , asa, seraquil 25 mg bid. lipitor 20 mg, usu takes ativan when anxious usu bid. nitrostat, roxinol usu not given, zantac and robitussin. muscle twitching like low K and has eaten 1/2 banana and 1/2 glass OJ. a little better now. heart is weak. Has the patient traveled out of the country within the last 30 days? ---No Does the patient require triage? ---Yes Related visit to physician within the last 2 weeks? ---No Does the PT have any chronic conditions? (i.e. diabetes, asthma, etc.) ---Yes List chronic conditions. ---cholesterol, anxiety Guidelines Guideline Title Affirmed Question Affirmed Notes Nurse Date/Time (Eastern Time) No Guideline Available - Sick Adult Call Nursing judgment or information in reference Henderson, RN, Georgeanne 11/06/2014 5:50:05 PM Disp. Time (Eastern Time) Disposition Final User 11/06/2014 5:52:57 PM Paged On Call back to Call Center Henderson, RN, Georgeanne 11/06/2014 5:53:41 PM Paged On Call back to Call Center Henderson, RN, Georgeanne PLEASE NOTE: All timestamps contained within this report are represented as Eastern Standard Time. CONFIDENTIALTY NOTICE: This fax transmission is intended only for the addressee. It contains information that is legally privileged, confidential or otherwise protected from use or disclosure. If you are not the intended recipient, you are strictly prohibited from reviewing, disclosing, copying using or disseminating any of this information or taking any action in reliance on or regarding this information. If you have received this fax in error, please notify us immediately by telephone so that we can arrange for its return to us. Phone: [phone], Toll-Free: [phone], Fax: [phone] Page: 2 of 2 Call Id: [id number] Disp. Time (Eastern Time) Disposition Final User Reason: dr. phil mcgowen 11/06/2014 6:25:39 PM Call Completed Henderson, RN, Georgeanne 11/06/2014  5:50:58 PM Call PCP Now Yes Henderson, RN, Georgeanne Caller   Understands: Yes Disagree/Comply: Comply Care Advice Given Per Guideline CALL PCP NOW: You need to discuss this with your doctor. I'll page him now. If you haven't heard from the on-call doctor within 30 minutes, call again. CALL BACK IF: * New symptoms develop * You become worse. CARE ADVICE per nursing judgment or reference (No Guideline Available - Sick Adult Call; Adult) After Care Instructions Given Call Event Type User Date / Time Description Referrals REFERRED TO PCP OFFICE Paging DoctorName DoctorPhone DateTime Result/Outcome Notes McGowen, Phil [phone] 11/06/2014 5:52:57 PM Paged On Call Back to Call Center McGowen, Phil 11/06/2014 6:05:08 PM Spoke with On Call - General took phone number for nursing home and will return call to them 

## 2014-11-17 ENCOUNTER — Telehealth: Payer: Self-pay

## 2014-11-17 DIAGNOSIS — T24212A Burn of second degree of left thigh, initial encounter: Secondary | ICD-10-CM

## 2014-11-17 NOTE — Telephone Encounter (Signed)
PLEASE NOTE: All timestamps contained within this report are represented as [place] Standard Time. CONFIDENTIALTY NOTICE: This fax transmission is intended only for the addressee. It contains information that is legally privileged, confidential or otherwise protected from use or disclosure. If you are not the intended recipient, you are strictly prohibited from reviewing, disclosing, copying using or disseminating any of this information or taking any action in reliance on or regarding this information. If you have received this fax in error, please notify us immediately by telephone so that we can arrange for its return to [place]. Phone: [phone], Toll-Free: [phone], Fax: [phone] Page: 1 of 2 Call Id: [id number] [facility] Night - Client TELEPHONE ADVICE RECORD [facility] Patient Name: [facility] Gender: Female DOB: November 02, 1931 Age: 81 Y 2 M 15 D Return Phone Number: Address: City/State/Zip: [place] [address] Client [facility] Night - Client Client Site [facility] - Night Physician Tillman Abide Contact Type Call Call Type Triage / Clinical Caller Name Puzio Relationship To Patient Care Giver Return Phone Number Unavailable Chief Complaint Leg Pain Initial Comment Caller the PT dropped a hot cup of coffee on her leg and it's red. [phone] is her call a back number. She is calling from [facility]. PreDisposition Call Doctor Nurse Assessment Nurse: Jean Rosenthal, RN, Amber Date/Time Lamount Cohen Time): 11/16/2014 5:56:25 PM Confirm and document reason for call. If symptomatic, describe symptoms. ---Caller states pt dropped a hot cup of coffee on her leg and now a 5 inch diameter on the leg is bright red. Has the patient traveled out of the country within the last 30 days? ---No Does the patient require triage? ---Yes Related visit to physician within the last 2 weeks? ---No Does the PT  have any chronic conditions? (i.e. diabetes, asthma, etc.) ---Yes List chronic conditions. ---Aortic Stenosis Dementia Guidelines Guideline Title Affirmed Question Affirmed Notes Nurse Date/Time Lamount Cohen Time) Burns - Thermal [1] Minor thermal burn AND [2] last tetanus shot > 10 years ago Jean Rosenthal, Charity fundraiser, [facility] 11/16/2014 5:58:37 PM Disp. Time Lamount Cohen Time) Disposition Final User 11/16/2014 6:03:32 PM See PCP When Office is Open (within 3 days) Yes Jean Rosenthal, RN, Biochemist, clinical Understands: Yes Disagree/Comply: Comply PLEASE NOTE: All timestamps contained within this report are represented as [place] Standard Time. CONFIDENTIALTY NOTICE: This fax transmission is intended only for the addressee. It contains information that is legally privileged, confidential or otherwise protected from use or disclosure. If you are not the intended recipient, you are strictly prohibited from reviewing, disclosing, copying using or disseminating any of this information or taking any action in reliance on or regarding this information. If you have received this fax in error, please notify us immediately by telephone so that we can arrange for its return to [place]. Phone: [phone], Toll-Free: [phone], Fax: [phone] Page: 2 of 2 Call Id: [id number] Care Advice Given Per Guideline SEE PCP WITHIN 3 DAYS: You need to be examined within 2 or 3 days. Call your doctor during regular office hours and make an appointment. (Note: if office will be open tomorrow, tell caller to call then, not in 3 days). CLEANSING: Wash the area gently with warm water once a day. Avoid soap unless the burn is dirty. Don't open any small closed blisters - the outer skin protects the burn from infection. RUPTURED (Broken or Open) BLISTERS: * You should remove the dead blister skin for any ruptured blisters. * METHOD 1: The easiest way to do this is gently  wipe away the dead skin with some wet gauze or a wet washcloth. * METHOD 2: If that  fails, trim off the dead skin with a fine scissors. ANTIBIOTIC OINTMENT FOR RUPTURED BLISTERS: * Apply an antibiotic ointment (e.g., OTC bacitracin) directly to a Band-Aid or dressing. (Reason: prevent unnecessary pain of applying it directly to burn). * Then apply the Band-Aid or dressing over the burn. CALL BACK IF: * Severe pain persists over 2 hours after giving pain medicine * You have difficulty trimming off dead skin or dressing the burn * Burn starts to look infected (pus, red streaks, increased tenderness) * Burn isn't healed after 10 days * You become worse. CARE ADVICE given per Lawerance Bach - Thermal (Adult) guideline. * Change the dressing every other day. Use warm water and 1 or 2 wipes with a wet washcloth to remove any surface debris. * Be gentle with burns. After Care Instructions Given Call Event Type User Date / Time Description Comments User: Leretha Pol, RN Date/Time Lamount Cohen Time): 11/16/2014 6:04:52 PM Caregiver on the phone was not interested in care advice and chose to rush off the phone and stated "I will just leave a note for the doctor". Completed care advice to the best of my ability at this time.

## 2014-11-17 NOTE — Telephone Encounter (Signed)
I can see her today at [facility] if needed

## 2014-11-20 ENCOUNTER — Encounter: Payer: Self-pay | Admitting: Cardiovascular Disease

## 2014-11-20 ENCOUNTER — Ambulatory Visit (INDEPENDENT_AMBULATORY_CARE_PROVIDER_SITE_OTHER): Payer: Medicare Other | Admitting: Cardiovascular Disease

## 2014-11-20 VITALS — BP 118/87 | HR 60 | Ht 59.0 in | Wt 112.3 lb

## 2014-11-20 DIAGNOSIS — I350 Nonrheumatic aortic (valve) stenosis: Secondary | ICD-10-CM

## 2014-11-20 DIAGNOSIS — I5022 Chronic systolic (congestive) heart failure: Secondary | ICD-10-CM

## 2014-11-20 NOTE — Assessment & Plan Note (Signed)
She appears to be euvolemic on current dose of furosemide. 

## 2014-11-20 NOTE — Assessment & Plan Note (Signed)
Surprisingly, she is doing reasonably well. It is difficult to know if she is symptomatic or not due to her mental status. Continue current management.

## 2014-11-20 NOTE — Patient Instructions (Signed)
Continue same medications.  Follow up in 4 months.  

## 2014-11-20 NOTE — Progress Notes (Signed)
HPI  This is an 81 year old woman who is here today for a followup visit regarding coronary artery disease, chronic systolic heart failure and critical aortic stenosis.  The patient has a history of coronary artery disease status post remote CABG in the 1990s. She also has a history of supraventricular tachycardia and has undergone radiofrequency ablation at [facility].  However, she was hospitalized in February with a urinary tract infection. She was found to have elevated troponin levels and underwent echocardiography demonstrating severe aortic stenosis with LV dysfunction. Cardiac catheterization was performed and this demonstrated severe native coronary artery disease with continued patency of all bypass grafts. She was transferred to [facility] where she underwent evaluation by the heart valve team. She was deemed to be not a good candidate for TAVR due to dementia.  Most recent echo in February of 2015 showed an ejection fraction of 40% with critical aortic stenosis and valve area of 0.25. The patient has severe dementia and cannot provide history. Losartan was discontinued due to hypotension. She has been doing very well since then and denies any chest pain, shortness of breath or lower extremity edema.  Allergies  Allergen Reactions  . Penicillins Other (See Comments)    Unknown  . Isosorbide Rash     Current Outpatient Prescriptions on File Prior to Visit  Medication Sig Dispense Refill  . acetaminophen (TYLENOL) 325 MG tablet Take 650 mg by mouth as needed.    Marland Kitchen acetaminophen (TYLENOL) 650 MG CR tablet Take 650 mg by mouth 3 (three) times daily.    Marland Kitchen aspirin 81 MG tablet Take 81 mg by mouth daily.    Marland Kitchen atorvastatin (LIPITOR) 20 MG tablet Take 20 mg by mouth daily.    . furosemide (LASIX) 20 MG tablet Take 20 mg by mouth 2 (two) times daily.    Marland Kitchen LORazepam (ATIVAN) 0.5 MG tablet Take 0.5 mg by mouth as needed for anxiety.    . metoprolol succinate (TOPROL-XL) 50 MG 24 hr  tablet Take 1 tablet (50 mg total) by mouth daily. Take with or immediately following a meal. 90 tablet 3  . morphine (KADIAN) 20 MG 24 hr capsule Take 20 mg by mouth as needed for pain.    . nitroGLYCERIN (NITROSTAT) 0.4 MG SL tablet Place 0.4 mg under the tongue every 5 (five) minutes as needed for chest pain.    . NON FORMULARY Med Pass 240 ml qd    . potassium chloride SA (K-DUR,KLOR-CON) 20 MEQ tablet Take 20 mEq by mouth daily.    . ranitidine (ZANTAC) 150 MG tablet Take 150 mg by mouth as needed for heartburn.    . rivastigmine (EXELON) 9.5 mg/24hr Place 9.5 mg onto the skin daily.     No current facility-administered medications on file prior to visit.     Past Medical History  Diagnosis Date  . Critical aortic valve stenosis     a. Relatively asymptomatic until 12/2013 - not a candidate for TAVR 2/2 dementia.  Marland Kitchen CAD (coronary artery disease)     a. s/p CABG 03/1997 at [facility] -  including LIMA to LAD, LRA to OM, SVG to RCA with open vein harvest left thigh.  . HTN (hypertension)   . Hyperlipidemia   . SVT (supraventricular tachycardia)     a. s/p ablation [facility].  . Polio     childhood  . Urinary incontinence   . Fecal impaction 12/24/2013  . History of UTI 12/24/2013    >100,000 col/mL Hafnia alvei  .  Heart murmur   . History of blood transfusion 1998    "pretty sure she had to have blood when she had bypass surgery  . DJD (degenerative joint disease)   . Osteoarthritis   . Arthritis     "pretty bad"  . Depression     "related to son on combat duty in [place]"   . Moderate dementia     "at times it's pretty bad" (12/31/2013)  . Ischemic cardiomyopathy     a. 12/2013 Echo: EF 20-25%, diff HK, mild AI/Sev AS, mild MR, mod TR.  Marland Kitchen Chronic systolic CHF (congestive heart failure)     a. 12/2013 Echo: EF 20-25%     Past Surgical History  Procedure Laterality Date  . Abdominal hysterectomy    . Bunionectomy    . Ganglion cyst excision    . Cholecystectomy    . Coronary artery  bypass graft  04/02/1997    CABG x3 at [facility] using LIMA to LAD, LRA to OM, SVG to RCA with open vein harvest left thigh  . Total knee arthroplasty Left   . Total knee arthroplasty Right   . Atrial tach ablation      [facility]  . Ankle arthroplasty Right ~ 19138    "polio"  . Tonsillectomy    . Carpal tunnel release Bilateral     "I think she's had both sides" (12/31/2013)  . Cataract extraction w/ intraocular lens  implant, bilateral Bilateral   . Cardiac catheterization      "more than 1"   . Coronary angioplasty    . Cardiac catheterization  12/2013    [facility]     Family History  Problem Relation Age of Onset  . Heart attack Father   . Heart attack Brother   . Heart attack Mother      History   Social History  . Marital Status: Married    Spouse Name: N/A    Number of Children: N/A  . Years of Education: N/A   Occupational History  . Retired    Social History Main Topics  . Smoking status: Never Smoker   . Smokeless tobacco: Never Used  . Alcohol Use: No  . Drug Use: No  . Sexual Activity: No   Other Topics Concern  . Not on file   Social History Narrative      PHYSICAL EXAM   BP 118/87 mmHg  Pulse 60  Ht  (1.499 m)  Wt 112 lb 5 oz (50.945 kg)  BMI 22.67 kg/m2 Constitutional: She is oriented to person, place, and time. She appears well-developed and well-nourished. No distress.  HENT: No nasal discharge.  Head: Normocephalic and atraumatic.  Eyes: Pupils are equal and round. No discharge.  Neck: Normal range of motion. Neck supple. Mild JVD present. No thyromegaly present.  Cardiovascular: Normal rate, regular rhythm, normal heart sounds. Exam reveals no gallop and no friction rub. There is a 4/6 systolic ejection murmur at the aortic area which is late peaking with absent S2  Pulmonary/Chest: Effort normal and breath sounds normal. No stridor. No respiratory distress. She has no wheezes. She has some bibasilar crackles  Abdominal: Soft. Bowel sounds  are normal. She exhibits no distension. There is no tenderness. There is no rebound and no guarding.  Musculoskeletal: Normal range of motion. She exhibits no edema and no tenderness.  Neurological: She is alert and oriented to person, place, and time. Coordination normal.  Skin: Skin is warm and dry. No rash noted. She is  not diaphoretic. No erythema. No pallor.  Psychiatric: She has a normal mood and affect. Her behavior is normal. Judgment and thought content normal.       ASSESSMENT AND PLAN

## 2014-12-26 IMAGING — CT CT ANGIO CHEST
2 of 9 series · 15 of 36 positions shown · IV contrast (APPLIED)
Comparison: None.

CLINICAL DATA: 82-year-old female with history of severe aortic
stenosis. Preprocedural study prior to potential transcatheter
aortic valve replacement (TAVR).

EXAM:
CT ANGIOGRAPHY CHEST, ABDOMEN AND PELVIS
TECHNIQUE: Multidetector CT imaging through the chest, abdomen and pelvis was
performed using the standard protocol during bolus administration of
intravenous contrast. Multiplanar reconstructed images and MIPs were
obtained and reviewed to evaluate the vascular anatomy.
CONTRAST:  100mL OMNIPAQUE IOHEXOL 350 MG/ML SOLN

[Series 5: dissection 2.0 i30f 3 · axial · 0.62mm/px · z∈[+840,+1348]mm · 14 of 288 slices shown]
[im 17/288  lung]
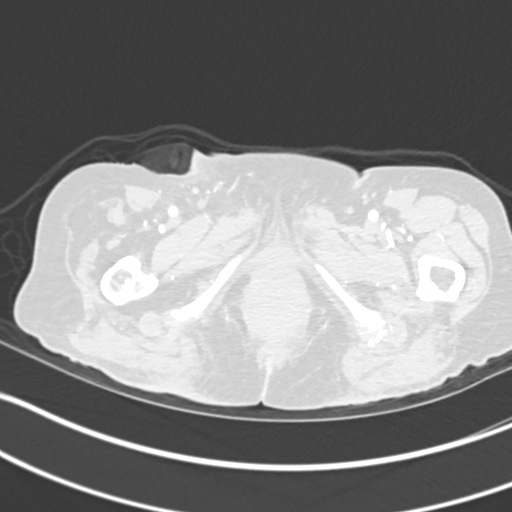
[im 34/288  mediastinal]
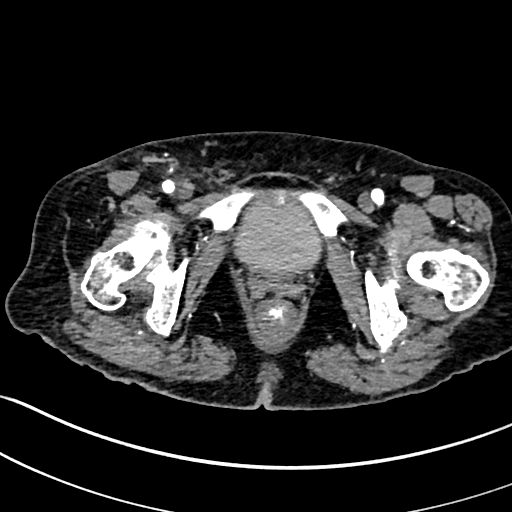
[im 51/288  lung]
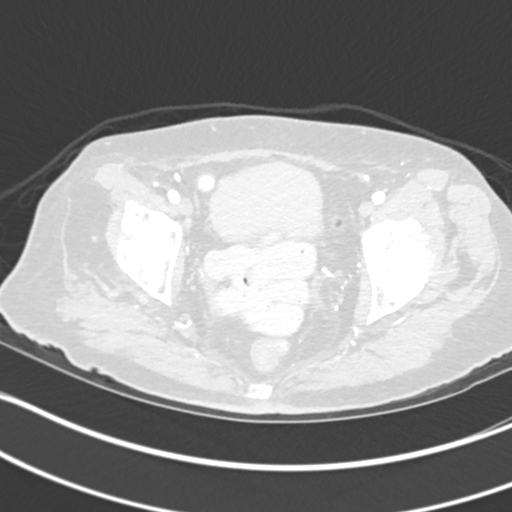
[im 85/288  mediastinal]
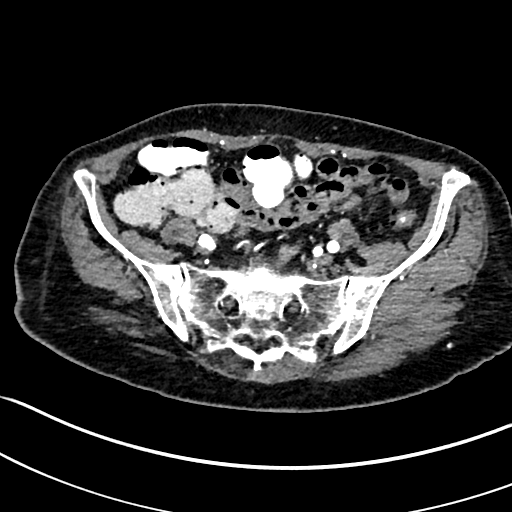
[im 102/288  lung]
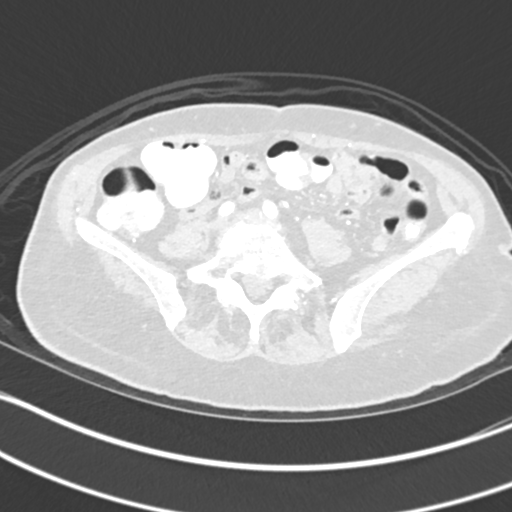
[im 119/288  mediastinal]
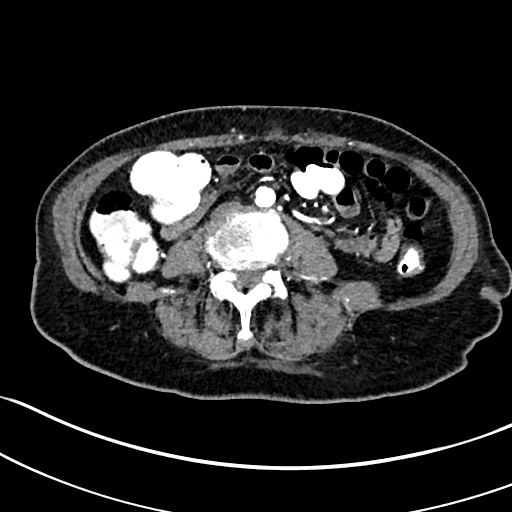
[im 136/288  lung]
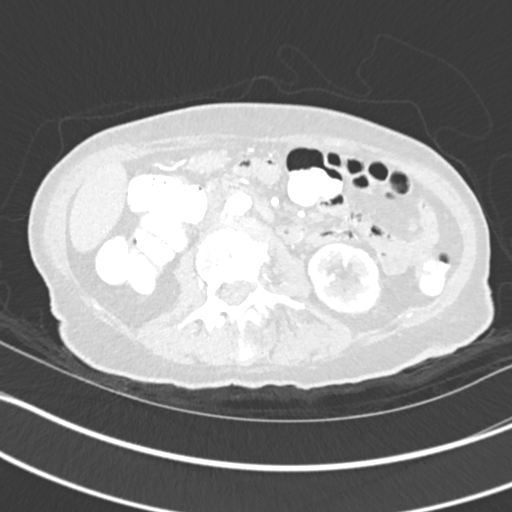
[im 152/288  mediastinal]
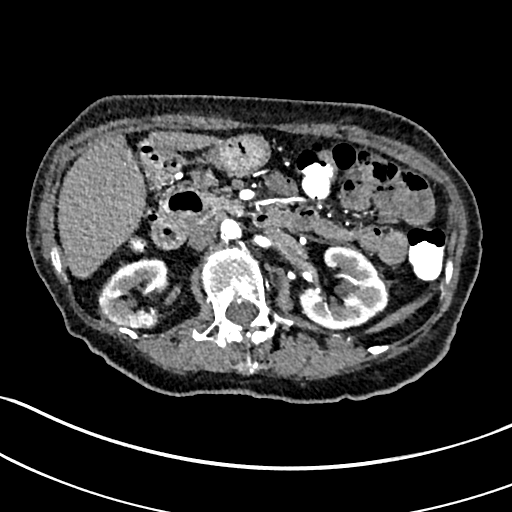
[im 169/288  lung]
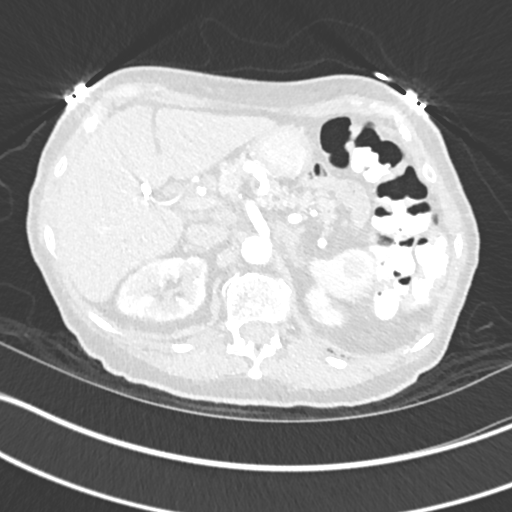
[im 186/288  mediastinal]
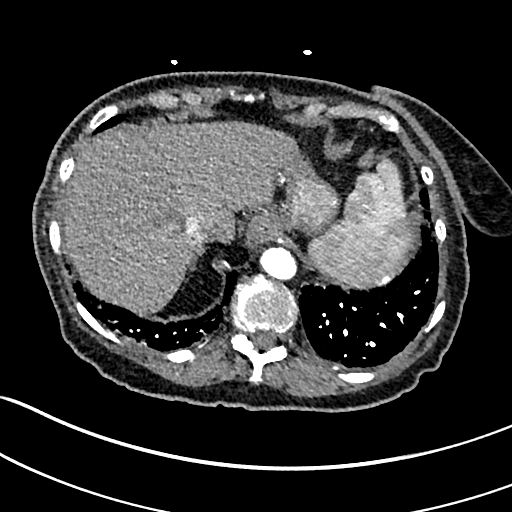
[im 220/288  lung]
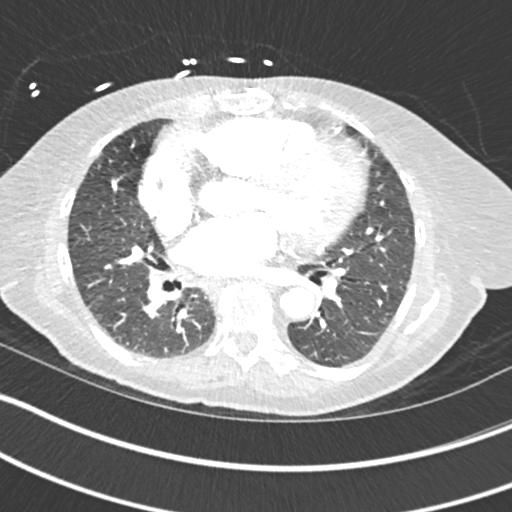
[im 237/288  mediastinal]
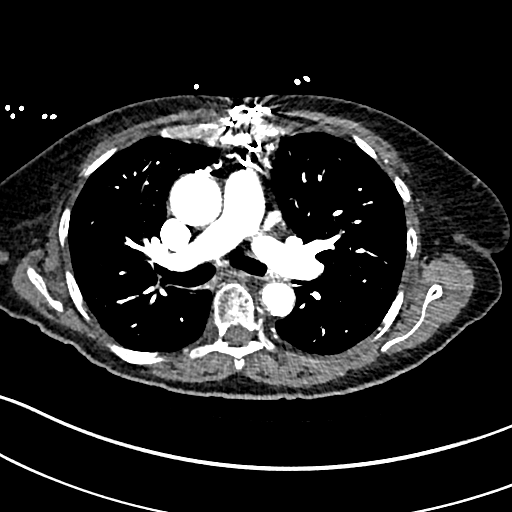
[im 254/288  lung]
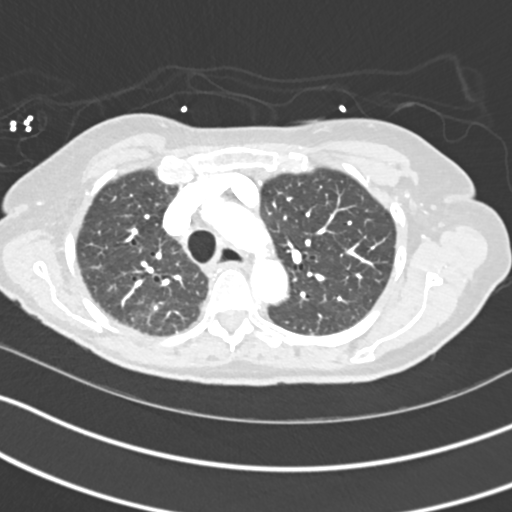
[im 271/288  mediastinal]
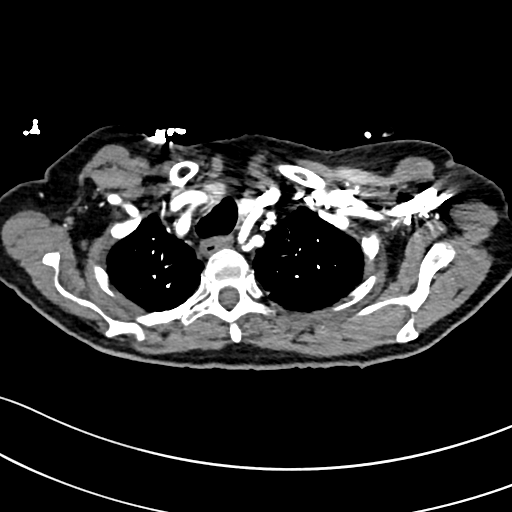

[Series 7: coronal mpr · coronal · 0.73mm/px · 1 of 107 slices shown]
[im 54/107  mediastinal]
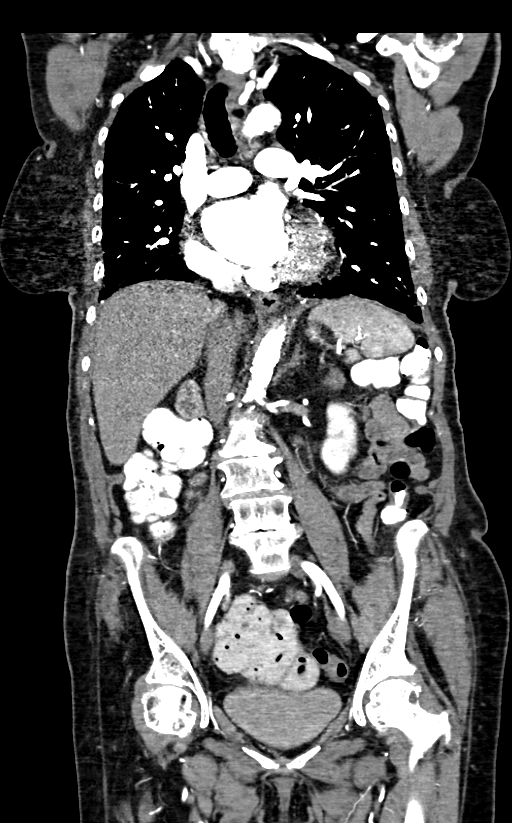

[15 of 36 positions shown; findings below may reference images not displayed]

FINDINGS: CT CHEST FINDINGS

Mediastinum: Heart size is enlarged. There is no significant
pericardial fluid, thickening or pericardial calcification. There is
atherosclerosis of the thoracic aorta, the great vessels of the
mediastinum and the coronary arteries, including calcified
atherosclerotic plaque in the left main, left anterior descending,
left circumflex and right coronary arteries. Status post median
sternotomy for CABG, including [REDACTED] to the LAD. Importantly, the
patient also appears to have a saphenous vein graft to the distal
RCA circulation which is immediately deep to the sternum just to the
left of midline, in a position at high risk for potential injury
should a repeat sternotomy be performed. Thickened and calcified
aortic valve. No pathologically enlarged mediastinal or hilar lymph
nodes. Esophagus is unremarkable in appearance.

Lungs/Pleura: 6 mm nodule in the apex of the left upper lobe (image
23 of series 12). No other larger more suspicious appearing
pulmonary nodules or masses are otherwise noted. No acute
consolidative airspace disease. No pleural effusions. Patchy areas
of mild subpleural reticulation and architectural distortion, most
pronounced in the extreme lung bases where there appears to be some
mild peripheral bronchiolectasis. No frank honeycombing is
identified at this time.

Musculoskeletal: Sternotomy wires. There are no aggressive appearing
lytic or blastic lesions noted in the visualized portions of the
skeleton.

CT ABDOMEN AND PELVIS FINDINGS

Abdomen/Pelvis: Status post cholecystectomy. The appearance of the
liver, pancreas, spleen and bilateral adrenal glands is
unremarkable. Mild bilateral renal atrophy with multifocal areas of
cortical thinning in the right kidney, most compatible with areas of
scarring from prior infection/infarction. The distal rectum is
decompressed, but there appears to be some circumferential what
rectal wall thickening. Proximal rectum and colon are otherwise
generally unremarkable. No significant volume of ascites. No
pneumoperitoneum. No pathologic distention of small bowel. No
lymphadenopathy identified in the abdomen or pelvis. Post procedural
changes in the right groin, likely related to recent arterial
access.

Musculoskeletal: 4 mm of anterolisthesis of L4 upon L5. S-shaped
scoliosis of the thoracolumbar spine convex to the left in the lower
thoracic spine, and to the right at the level of L3. There are no
aggressive appearing lytic or blastic lesions noted in the
visualized portions of the skeleton.

VASCULAR MEASUREMENTS PERTINENT TO TAVR:

AORTA:

Minimal Aortic Diameter - 7.5 x 6.4 mm immediately beneath the level
of the renal arteries

Severity of Aortic Calcification -  Moderate

RIGHT PELVIS:

Right Common Iliac Artery -

Minimal Diameter - 5.4 x 5.0 mm

Tortuosity - Mild

Calcification - Mild

Right External Iliac Artery -

Minimal Diameter - 5.3 x 5.3 mm

Tortuosity - Mild

Calcification - Mild

Right Common Femoral Artery -

Minimal Diameter - 5.5 x 5.7 mm

Tortuosity - Mild

Calcification - Mild

LEFT PELVIS:

Left Common Iliac Artery -

Minimal Diameter - 6.7 x 5.8 mm

Tortuosity - Mild

Calcification - Mild

Left External Iliac Artery -

Minimal Diameter - 6.2 x 6.1 mm

Tortuosity - Mild

Calcification -Mild

Left Common Femoral Artery -

Minimal Diameter - 6.2 x 6.2 mm

Tortuosity - Mild

Calcification - Mild

Review of the MIP images confirms the above findings.
IMPRESSION: 1. Although the patient has small pelvic arteries, she does appear
to have suitable pelvic arterial access on the left side for
potential TAVR procedure, as detailed above.
2. Status post median sternotomy for CABG including [REDACTED] to the LAD.
Additionally, the patient has what appears to be a saphenous vein
graft extending to the distal RCA circulation. This graft is in an
unusual anterior position immediately deep to the sternum placing
the graft at risk for potential injury should a repeat sternotomy be
attempted.
3. The appearance of the lung parenchyma could suggest early changes
of interstitial lung disease, but is nonspecific at this time.
Repeat high-resolution chest CT is suggested in one year to assess
for temporal changes in the appearance of the lung parenchyma.
4. Circumferential thickening of the distal rectum. This may in part
be artifactual related to decompression of the distal rectum,
however, correlation with sigmoidoscopy or colonoscopy is suggested
in the near future to exclude the possibility of a rectal neoplasm.
5. Additional incidental findings, as above.

## 2014-12-31 DIAGNOSIS — I5022 Chronic systolic (congestive) heart failure: Secondary | ICD-10-CM | POA: Diagnosis not present

## 2014-12-31 DIAGNOSIS — I350 Nonrheumatic aortic (valve) stenosis: Secondary | ICD-10-CM

## 2014-12-31 DIAGNOSIS — G301 Alzheimer's disease with late onset: Secondary | ICD-10-CM | POA: Diagnosis not present

## 2014-12-31 DIAGNOSIS — M150 Primary generalized (osteo)arthritis: Secondary | ICD-10-CM | POA: Diagnosis not present

## 2014-12-31 DIAGNOSIS — F29 Unspecified psychosis not due to a substance or known physiological condition: Secondary | ICD-10-CM

## 2015-01-26 DIAGNOSIS — R05 Cough: Secondary | ICD-10-CM

## 2015-01-27 DIAGNOSIS — K1379 Other lesions of oral mucosa: Secondary | ICD-10-CM | POA: Diagnosis not present

## 2015-02-26 DIAGNOSIS — F22 Delusional disorders: Secondary | ICD-10-CM

## 2015-02-26 DIAGNOSIS — M1990 Unspecified osteoarthritis, unspecified site: Secondary | ICD-10-CM | POA: Diagnosis not present

## 2015-02-26 DIAGNOSIS — I350 Nonrheumatic aortic (valve) stenosis: Secondary | ICD-10-CM

## 2015-02-26 DIAGNOSIS — I5030 Unspecified diastolic (congestive) heart failure: Secondary | ICD-10-CM | POA: Diagnosis not present

## 2015-02-26 DIAGNOSIS — F323 Major depressive disorder, single episode, severe with psychotic features: Secondary | ICD-10-CM

## 2015-02-26 DIAGNOSIS — G309 Alzheimer's disease, unspecified: Secondary | ICD-10-CM | POA: Diagnosis not present

## 2015-02-26 NOTE — Discharge Summary (Signed)
PATIENT NAME:  [name], [name] MR#:  [id number] DATE OF BIRTH:  10-17-1931  DATE OF ADMISSION:  11/24/2012 DATE OF DISCHARGE:  11/26/2012  PRIMARY CARE PHYSICIAN: Dr. Randa Lynn.   DISCHARGE DIAGNOSES: 1.  Escherichia coli urinary tract infection.  2.  Dehydration.  3.  Acute encephalopathy, resolved.   IMAGING STUDIES:  1.  CT scan of the head without contrast, which showed no acute abnormalities.  2.  CT cervical spine with contrast showed multiple vertebral degenerative joint disease.  3.  MRI of the brain showed microvascular changes without acute acute stroke.  4.  Carotid Doppler's bilaterally showed no significant stenosis.   ADMITTING HISTORY AND PHYSICAL: Please see detailed history and physical dictated by Dr. Jacques Navy. In brief, this is a 81 year old Caucasian female patient with multiple comorbidities of coronary artery disease, CABG, SVT, degenerative joint disease and chronic neck pain, who presented to the Emergency Room complaining of short-term memory problems and confusion, as per the husband. The patient was found to have a UTI,  was admitted to the hospitalist service for work-up and treatment of UTI and possible stroke.   HOSPITAL COURSE: 1.  Acute encephalopathy. This was secondary to her E. coli UTI. The patient did have MRI of the brain, which showed no acute strokes.  She did not have any focal neurological deficits. The patient was discharged home on ciprofloxacin in a fair condition.   On the day of discharge, the patient's temperature was 97.8, pulse of 67, blood pressure 134/85, saturating 97% on room air. She was set up with home health for physical therapy and discharged home.   DISCHARGE MEDICATIONS: Include: 1.  Hydrochlorothiazide 25 mg oral once a day.  2.  Crestor 10 mg oral once a day.  3.  Celebrex 200 mg oral 2-3 times a week as needed.  4.  Metoprolol succinate 100 mg oral once a day.  5.  Aspirin 81 mg oral once a day.  6.  Calcium, vitamin D 1 tablet  oral once a day.  7.  Multivitamin 1 tablet oral once a day.  8.  Ciprofloxacin 500 mg oral two times a day for four days.  9.  Toviaz 8 mg oral once a day.   DISCHARGE INSTRUCTIONS: Continue with physical therapy with home health.   DIET:  Cardiac diet.   ACTIVITY:  As tolerated with assistance.   TIME SPENT: Time spent today on the day of discharge in discharge activity was 35 minutes.    ____________________________ Cheryl Bailiff Latanza Pfefferkorn, MD srs:cc D: 11/27/2012 14:50:13 ET Butler: 11/27/2012 16:25:17 ET JOB#: [id number]  cc: Wardell Heath R. Crosley Stejskal, MD, <Dictator> Reola Mosher. Randa Lynn, MD Orie Fisherman MD ELECTRONICALLY SIGNED 12/12/2012 13:22

## 2015-02-26 NOTE — Op Note (Signed)
PATIENT NAME:  [name], [name] Mattea T MR#:  [id number] DATE OF BIRTH:  [birth year]  DATE OF PROCEDURE:  10/20/2013  PREOPERATIVE DIAGNOSIS: Cataract, right eye.   POSTOPERATIVE DIAGNOSIS: Cataract, right eye.  PROCEDURE PERFORMED: Extracapsular cataract extraction using phacoemulsification with placement of Alcon [id number], 24.0-diopter posterior chamber lens, serial number [id number].   SURGEON: Maylon Peppers. Harriet Sutphen, M.D.   ANESTHESIA: 4% lidocaine and 0.75% Marcaine a 50-50 mixture with 10 units/mL of Hylenex added, given as a peribulbar.   ANESTHESIOLOGIST: Dr. Randel Pigg.   COMPLICATIONS: None.   ESTIMATED BLOOD LOSS: Less than 1 mL.   DESCRIPTION OF PROCEDURE: The patient was brought to the operating room and given a peribulbar block.  The patient was then prepped and draped in the usual fashion.  The vertical rectus muscles were imbricated using 5-0 silk sutures.  These sutures were then clamped to the sterile drapes as bridle sutures.  A limbal peritomy was performed extending two clock hours and hemostasis was obtained with cautery.  A partial thickness scleral groove was made at the surgical limbus and dissected anteriorly in a lamellar dissection using an Alcon crescent knife.  The anterior chamber was entered superonasally with a Superblade and through the lamellar dissection with a 2.6 mm keratome.  DisCoVisc was used to replace the aqueous and a continuous tear capsulorrhexis was carried out.  Hydrodissection and hydrodelineation were carried out with balanced salt and a 27 gauge canula.  The nucleus was rotated to confirm the effectiveness of the hydrodissection.  Phacoemulsification was carried out using a divide-and-conquer technique.  Total ultrasound time was 1 minute and 10 seconds with an average power of  25.2%t.  CDE 35.07.  Irrigation/aspiration was used to remove the residual cortex.  DisCoVisc was used to inflate the capsule and the internal incision was enlarged to 3 mm with the  crescent knife.  The intraocular lens was folded and inserted into the capsular bag using the AcrySert delivery system.  Irrigation/aspiration was used to remove the residual DisCoVisc.  Miostat was injected into the anterior chamber through the paracentesis track to inflate the anterior chamber and induce miosis.  The wound was checked for leaks and none were found. The conjunctiva was closed with cautery and the bridle sutures were removed.  Two drops of 0.3% Vigamox were placed on the eye.   An eye shield was placed on the eye.  The patient was discharged to the recovery room in good condition.  ____________________________ Maylon Peppers Abimelec Grochowski, MD sad:aw D: 10/20/2013 12:38:05 ET T: 10/20/2013 13:01:00 ET JOB#: [id number]  cc: Viviann Spare A. Luvina Poirier, MD, <Dictator> Erline Levine MD ELECTRONICALLY SIGNED 11/03/2013 13:31

## 2015-02-26 NOTE — H&P (Signed)
DATE OF BIRTH:  May 18, 1931  REFERRING PHYSICIAN:  Dr. Mindi Junker.  PRIMARY CARE PHYSICIAN:  Dr. Randa Lynn.  CHIEF COMPLAINT:  Weakness, confusion.   HISTORY OF PRESENT ILLNESS:  The patient is a pleasant 81 year old Caucasian female with multiple comorbidities including CAD status post CABG and stenting in the past, SVT status post ablation, hyperlipidemia, history of polio, DJD and chronic neck pain that is being worked up as an outpatient, who presents with progressive weakness to the point of having to hold objects and walls in order not to fall, as well as confusion and short-term memory problems in the last several months per her husband, who is in the room. The patient had a fall yesterday morning, and last night, was more confused and had difficulty speaking and articulating her words. She had a fever this morning when EMS was called. She was found with some aphasia, and initially felt that she possibly had TIA. However, her speech has returned. She is talking in full sentences now. She denies any major complaints except for her chronic neck pain. She was found to have a temperature of 100.8 per EMS, and she has leukocytosis and a probable UTI, and hospitalist service was contacted for further evaluation and management.   PAST MEDICAL HISTORY:  CAD status post CABG and stenting back in 1990s, hyperlipidemia, hypertension, history of polio, DJD with bilateral knee replacements, history of SVT status post ablation, per family history of urinary incontinence, history of hysterectomy, gallbladder surgery, bunion surgery.   ALLERGIES:  ISOSORBIDE MONONITRATE and PENICILLIN.   OUTPATIENT MEDICATIONS:  Amlodipine 10 mg daily, aspirin 81 mg daily, calcium/vitamin D 600/400 international units chewable 1 tab once a day, Celebrex 200 mg 1 tab 2 to 3 times a week, coenzyme Q 100 mg once a day, Crestor 10 mg daily, hydrochlorothiazide 25 mg daily, metoprolol succinate extended release 100 mg once a day,  multivitamin 1 tab daily, oxybutynin extended release 1 tab once a day.   SOCIAL HISTORY:  No tobacco, alcohol or drug use. Married. Lives with her husband.   FAMILY HISTORY:  CAD in a brother with small vessel lung disease.   REVIEW OF SYSTEMS:  Per family:  CONSTITUTIONAL:  Positive for fever this morning. Positive for weakness progressive for several months and fatigue. No weight changes.  EYES:  No blurry vision or double vision.  ENT:  No tinnitus or hearing loss.  RESPIRATORY:  No cough, wheezing or shortness of breath.  CARDIOVASCULAR:  No chest pain or orthopnea. No swelling in the legs. History of SVT and CABG and CAD in the past.  GASTROINTESTINAL:  No nausea, vomiting or diarrhea.  GU:  Positive for polyuria. No dysuria.  HEMATOLOGIC/LYMPHATIC:  No anemia or easy bruising.  SKIN:  No new rashes.  MUSCULOSKELETAL:  Has chronic neck pain with chronic arthritis, bilateral knee pains.  NEUROLOGIC:  No history of stroke in the past.  PSYCHIATRIC:  Some forgetfulness and memory issues more recently.   PHYSICAL EXAMINATION:  VITAL SIGNS:  Temperature on arrival was 99.4, per EMS it was 100.8, pulse 88, respiratory rate 20, blood pressure 161/67, O2 sat 95% on room air.  GENERAL:  The patient is an elderly Caucasian female lying in bed, talking in full sentences, not acutely distressed.  HEENT:  Normocephalic, atraumatic. Pupils are equal and reactive. Anicteric sclerae. Moist mucous membranes.  NECK:  Supple. No thyroid tenderness. No cervical lymphadenopathy.  CARDIOVASCULAR:  S1, S2 regular rate and rhythm. Positive for murmur, systolic in the aortic region.  LUNGS:  Clear to auscultation without wheezing, rhonchi or rales.  ABDOMEN:  Soft, nontender, nondistended.  EXTREMITIES:  No significant lower extremity edema.  NEUROLOGIC:  Cranial nerves II through XII grossly intact. Strength is 5/5 in all extremities. Sensation intact to light touch.  SKIN:  No obvious rashes.   PSYCHIATRIC:  Awake, alert. Oriented to person and place, not to time.   LABORATORIES:  Glucose 105, BUN 15, creatinine 0.55, potassium 3.4. Total bilirubin 1.3, AST 43, otherwise within normal limits in the LFT region. Troponin negative x 1. WBC 14.1, platelets 136, hemoglobin 15.1. UA positive nitrites, 2+ leukocyte esterase, 151 WBC, 2+ bacteria. There are mucus and WBC clumps. EKG sinus rhythm, possible ventricular hypertrophy and some nonspecific T-wave abnormalities, possible right ventricular conduction delay. CT of the head without contrast showing no acute intracranial process, chronic small vessel ischemic disease. CT cervical spine without contrast showing no cervical spine fractures, multilevel mild anterior listhesis, maybe degenerative. AP x-ray 1 view showing no fractures.   ASSESSMENT AND PLAN:  A 81 year old Caucasian female with coronary artery disease status post coronary artery bypass grafting and stenting in the past, hyperlipidemia, hypertension, supraventricular tachycardia status post ablation, with progressive forgetfulness, short-term memory issues and weakness for the past several months, status post a fall and aphasia, weakness, and found with sepsis with fever, leukocytosis and a urinary tract infection. Furthermore, the patient was noted to be aphasic initially in the Emergency Room and had some confusion last night. At this point, we will admit the patient to the hospital. The patient likely has sepsis. We will follow up blood culture, urine cultures, and the patient has received ceftriaxone. I will change that to Levaquin due to PENICILLIN allergy. In regards to the aphasia and confusion, the patient is neurologically intact now. The patient is confused, but per her family, she has had confusion and short-term memory issues for the past several months. We will obtain MRI to rule out a stroke, but she possibly could have had a transient ischemic attack. However, delirium and  metabolic encephalopathy in the setting of sepsis and urinary tract infection is another possibility. We would get an echocardiogram and check ultrasound carotids to look for significant stenosis, check frequent neurologic checks, resume aspirin, statin, and place her on remote telemetry and do frequent neurologic checks. We would continue the statin, continue the beta blocker and amlodipine, and hold hydrochlorothiazide. The patient has mild hypokalemia, which would be repleted. We would also obtain a physical therapy consult. The patient might need short-term rehabilitation. The patient is FULL CODE. Heparin for deep vein thrombosis prophylaxis.   TOTAL TIME SPENT:  60 minutes.    ____________________________ Krystal Eaton, MD sa:ms D: 11/24/2012 18:08:27 ET T: 11/24/2012 18:55:28 ET JOB#: [id number]  cc: Krystal Eaton, MD, <Dictator> Reola Mosher. Randa Lynn, MD Krystal Eaton MD ELECTRONICALLY SIGNED 12/12/2012 04:54

## 2015-02-27 NOTE — Consult Note (Signed)
General Aspect 81 y/o F with h/o CAD s/p CABG in 1995 (LIMA->LAD, SVG->OM3, SVG->RPDA), SVT s/p RFA at [facility], severe aortic stenosis (see below), HTN, HLD, urinary incontinence and DJD who presented to [facility] ED 05/19/2014 after sustaining a fall and was diagnosed with a UTI and sent home. Tx'd with Rocephin in ED and given rx for Bactrim. Woke up this AM with abdominal pain, needing to use the restroom, and feeling very weak. Husband called EMS and patient was transported to [facility]. Initial troponin <0.02-0.87. EKG with sinus tachycardia, 112, LVH, no st/t changes.  ___________________________   Present Illness 81 y/o F with the above complex problem list who presented to the ED yesterday after a fall and was diagnosed with a UTI. Given Rocephin and Bactrim. Woke up this AM needing to use the restroom, had continued abdominal pain, and felt extremely weak. Husband could not move her so he called EMS and she was brought to [facility]. History is obtained from husband. Upon admission to [facility] troponin <0.02-0.87. Husband notes some exertional chest over the past couple of weeks in her, but she did not complain of any the past two days.   Her last admission to [facility] 12/2013 she was transfered to [facility] for further evaluation of her severe aortic stenosis (mean gradient 59 mm Hg, peak gradient 86 mm Hg.) At that time she could not complete her work up in the hospital 2/2 CT scanner not working and she followed up as outpatient. On 01/23/2014 she saw Dr. Cornelius Moras, who consulted with Dr. Copper and they both agreed that she would not be a good candidate for open replacement of her aortic valve or TAVR. Both the patient and her husband understood and were in agreement.   Last echo: 12/31/2013 with an EF 20-25%. Systolic function was severely reduced. The diffuse HK. There was an increased relative contribution of atrial contraction to ventricular filling. Aortic valve: There was severe stenosis. Mild regurgitation. Valve area:  0.25cm^2(VTI). Valve area: 0.27cm^2 (Vmax). Mitral valve: Mild regurgitation. Tricuspid valve: Moderate regurgitation. Pulmonary arteries: PA peak pressure: 32mm Hg (S).   Last cath: 12/29/2013: EF 40%. LM normal. pLAD 60% diffuse stenosis, , mLAD 100% stenosis; 1st diag 60%; mCx 90%, dCx 100%, OM1 small vessel, OM2 80% prox; RCA 100% prox; graft to LAD LIMA no dz, SVG->OM3 minor irregs, SVG->RPDA minor irregs.   Physical Exam:  GEN uncomfortable appearing   NECK supple   RESP normal resp effort  clear BS   CARD Regular rate and rhythm  Murmur   Murmur Systolic  4/6 harsh   ABD positive tenderness  soft  normal BS   EXTR negative edema   SKIN normal to palpation   NEURO cranial nerves intact   PSYCH alert, demented   Review of Systems:  ROS Pt not able to provide ROS   Medications/Allergies Reviewed Medications/Allergies reviewed   Family & Social History:  Family and Social History:  Family History Coronary Artery Disease   Social History negative tobacco, negative ETOH, negative Illicit drugs     Dementia: advanced, per chart notes   POLIO:    hypertension:    right TKR:    left total knee [id number]:    Arthroscopy left knee: 2007   Cervical disk fusion: 2007   cholecystectomy: 2005   Bunion surgery right foot: 1995   Carpal tunnel release - both: 1986   Tonsillectomy: 1965   hysterectomy: 1975   CABG: 1998  Home Medications: Medication Instructions Status  Bactrim DS  800 mg-160 mg oral tablet 1 tab(s) orally 2 times a day Active  Exelon 9.5 mg/24 hr transdermal film, extended release 1  transdermal once a day Active  Colace sodium 100 mg oral capsule 1 cap(s) orally 2 times a day Active  Metoprolol Succinate ER 100 mg oral tablet, extended release 1 tab(s) orally once a day Active  aspirin 81 mg oral tablet 1 tab(s) orally once a day Active  Toviaz 8 mg oral tablet, extended release 1 tab(s) orally once a day Active  Celebrex 200 mg oral  capsule 1 cap(s) orally once a day Active  citalopram 20 mg oral tablet 1 tab(s) orally once a day Active  losartan 50 mg oral tablet 1 tab(s) orally once a day Active  amLODIPine 5 mg oral tablet 1 tab(s) orally once a day Active  atorvastatin 20 mg oral tablet 1 tab(s) orally once a day (at bedtime) Active  LORazepam 0.5 mg oral tablet 1 tab(s) orally 2 times a day, As Needed - for Agitation Active   Lab Results:  Routine Chem:  17-Jul-15 07:37   Result Comment Troponin - RESULTS VERIFIED BY REPEAT TESTING.  - Elevated troponin called to and read  - back by Reino Bellis @ [phone] 05/22/14  - BGB  Result(s) reported on 22 May 2014 at 08:35AM.  Cardiac:  17-Jul-15 07:37   CPK-MB, Serum  4.8 (Result(s) reported on 22 May 2014 at 08:10AM.)  Troponin I  0.87 (0.00-0.05 0.05 ng/mL or less: NEGATIVE  Repeat testing in 3-6 hrs  if clinically indicated. >0.05 ng/mL: POTENTIAL  MYOCARDIAL INJURY. Repeat  testing in 3-6 hrs if  clinically indicated. NOTE: An increase or decrease  of 30% or more on serial  testing suggests a  clinically important change)    Isosorbide Mononitrate: Rash  Penicillin: Rash  Vital Signs/Nurse's Notes: **Vital Signs.:   17-Jul-15 11:40  Vital Signs Type Routine  Temperature Temperature (F) 98.4  Celsius 36.8  Pulse Pulse 102  Respirations Respirations 20  Systolic BP Systolic BP 130  Diastolic BP (mmHg) Diastolic BP (mmHg) 81  Mean BP 97  Pulse Ox % Pulse Ox % 95  Pulse Ox Activity Level  At rest  Oxygen Delivery 2L    Impression 81 y/o F with CAD s/p CABG in 1995 (LIMA->LAD, SVG->OM3, SVG->RPDA), SVT s/p RFA at [facility], severe aortic stenosis, HTN, HLD, and urinary incontinence who presented to [facility] ED 05/19/2014 after sustaining a fall and was diagnosed with a UTI and sent home. Tx'd with Rocephin in ED and given rx for Bactrim. Woke up this AM with abdominal pain, needing to use the restroom, and feeling very weak. Husband called EMS and patient was  transported to [facility]. Initial troponin <0.02-0.87. EKG with sinus tachycardia, 112, LVH, no st/t changes.   Plan 1. Elevated troponin: Likely demand ischemia in the setting of her sepsis with acute cystitis pyelonephritis. Initial troponin <0.02-0.87. Await third troponin. Will order echo today to evaluate for any new changes. Husband does not want any invasive procedures at this time. Continue current medications.   2. Severe aortic stenosis: She recently underwent full evaluation with both Drs. Cornelius Moras and Copper at [facility] and was found to not be a candidate for open repair of her aortic valve or TAVR 2/2 to her dementia. She was doing well from a functional state at home prior to the above per the husband. Both husband and patient were ok with this and this continues to be the case.   3. Sepsis  with acute cystitis: Cultures have been obtained. On abx. Per IM.   4. Abdominal pain: CT abdomen/pelvis pending. Per IM.   Electronic Signatures for Addendum Section:  Lorine Bears (MD) (Signed Addendum 17-Jul-15 19:11)  The patient was seen and examined. Agree with the above with the following additions: she well known to me from previous admission. She has known history of CAD and critical aortic stenosis. She was evaluated at [facility] for TAVR but was deemed to be not a candidate due to advanced dementia. She presented with abdominal pain after a recent UTI. ECG showed diffuse ST depression. TnI is now 27. There is a harsh 3/6 SEM at aortc area.  Recommned: Given critical aortic stenosis and not being a candidate for AVR or TAVR, her prognosis is very poor especially current large MI.  recommend hospice and comfort care. I discussed this with husband.   Electronic Signatures: Lorine Bears (MD)  (Signed 17-Jul-15 19:11)  Co-Signer: General Aspect/Present Illness, History and Physical Exam, Review of System, Family & Social History, Past Medical History, Home Medications, Labs, Allergies, Vital  Signs/Nurse's Notes, Impression/Plan Shea Evans, Klaus Casteneda M (PA-C)  (Signed 17-Jul-15 12:23)  Authored: General Aspect/Present Illness, History and Physical Exam, Review of System, Family & Social History, Past Medical History, Home Medications, Labs, Allergies, Vital Signs/Nurse's Notes, Impression/Plan   Last Updated: 17-Jul-15 19:11 by Lorine Bears (MD)

## 2015-02-27 NOTE — Discharge Summary (Signed)
PATIENT NAME:  [name], [name] Lesta T MR#:  [id number] DATE OF BIRTH:  Oct 30, 1931  DATE OF ADMISSION:  12/25/2013 DATE OF DISCHARGE:  12/29/2013  ADMITTING DIAGNOSIS: Abdominal pain.   DISCHARGE DIAGNOSES: 1.  Abdominal pain due to fecal impaction.  2.  Suspected  stercoral colitis, resolved.  3.  Urinary tract infection due to Hafnia Alvei.  4.  Acidosis due to above.  5.  Elevated troponin, likely demand ischemia.  6.  Severe aortic stenosis status post cardiac catheterization by Dr. Lorine Bears on the 23rd of February 2015 revealing cardiomyopathy with ejection fraction of 40%. Hemodynamic assessment demonstrated mild systemic hypertension, no hypotension. Severely depressed cardiac output. Mildly elevated pulmonary capillary wedge pressure as well as moderately to severely elevated pulmonary vascular resistance. Recommend aortic valve replacement versus TAVR, transfer to cardio-surgery.   DISCHARGE CONDITION: Stable.   DISCHARGE MEDICATIONS: The patient is to continue: 1.  Metoprolol succinate 100 mg p.o. daily. 2.  Aspirin 81 mg p.o. daily. 3.  Toviaz 80 mg p.o. daily. 4.  Celebrex 200 mg p.o. daily. 5.  Citalopram 20 mg p.o. daily. 6.  Losartan 50 mg p.o. daily. 7.  Centrum Silver 1 tablet once daily. 8.  Citracal with calcium 1200/500 international units 1 tablet once daily. 9.  Amlodipine 10 mg p.o. daily.  10.  Exelon 9.5 mg transdermal film once daily.  11.  Colace 100 mg p.o. twice daily.  12.  Metronidazole 500 mg p.o. every 8 hours for 3 more days. 13.  Ciprofloxacin 250 mg p.o. twice daily for 3 more days.   HOME OXYGEN: None.   DIET: 2 grams salt, low fat, low cholesterol, regular consistency.   ACTIVITY LIMITATIONS: As tolerated.   REFERRALS: To physical therapy. Home health physical therapy will be advised upon discharge.   DISCHARGE FOLLOWUP: With Dr. Larwance Sachs in 2 days after discharge as well as Dr. Mariah Milling and Dr. Kirke Corin in the next 2 days after discharge.    DISPOSITION: The patient is being transferred to [facility] cardio-surgery for consideration of aortic valve replacement.   CONSULTANTS: Dr. Kirke Corin, Dr. Mariah Milling, care management, social work.   RADIOLOGIC STUDIES: CT scan of abdomen and pelvis with contrast, 18th of February 2015, revealed moderate fecal material descending the splenic flexure and rectum with prominent mucosal enhancement in the transverse colon suggesting possibly early stercoral colitis.   HISTORY OF PRESENT ILLNESS: The patient is an 81 year old Caucasian female with past medical history significant for history of multiple medical problems including coronary artery disease, coronary artery bypass grafting and stenting in 1990s, hypertension, hyperlipidemia, history of degenerative joint disease, and SVT status post ablation who presents to the hospital with complaints of abdominal pain. Please refer to Dr. Clarita Leber admission note on the 18th of February 2015.   On arrival to the hospital, the patient's vital signs: Temperature was 98.7, pulse was 88, respiratory rate was 20, blood pressure 170/76, and saturation was 93% on room air. Physical exam revealed a systolic murmur radiating to carotids and abdominal exam was unremarkable.   LABORATORY DATA: Done on admission revealed normal BMP. Elevated calcium level to 10.2. Magnesium 2.1. Lipase 114. Total bilirubin was elevated at 1.1, otherwise liver enzymes were normal. Cardiac enzymes, first set, troponin was elevated at 0.37 with normal MB fraction, second set 0.46, and third set 0.56 with normal MB fraction. The patient's CBC was within normal limits however hemoconcentrated with hemoglobin level of 15.8 and platelet count 185. The patient's urinalysis was remarkable for 3+ leukocyte esterase, 2  red blood cells 25 white blood cells, cloudy yellow urine. Urine cultures came back positive for more than 100,000 colony-forming units of Hafnia Alvei sensitive to trimethoprim  sulfamethoxazole, nitrofurantoin, ceftriaxone, and ciprofloxacin, also to gentamicin, imipenem, and levofloxacin. Resistant to ampicillin.   HOSPITAL COURSE: The patient was admitted to the hospital for further evaluation. She was initiated on Flagyl as well as ciprofloxacin IV initially due to concerns of colitis, and she was  decompressed with medications after which the patient's abdominal pain resolved. She was evaluated by cardiologist because of her elevated troponin. Echocardiogram was performed. Echocardiogram was performed on the 19th of February 2015, which revealed an ejection fraction of 40% to 45%, mildly decreased global left ventricular systolic function, also possible regional wall motion abnormality, mild left ventricular hypertrophy, normal right ventricular size as well as systolic function, and severe aortic valve stenosis with mean gradient of 59 mmHg, peak gradient of 86 mmHg, mild to moderate tricuspid regurgitation, and mildly elevated pulmonary arterial systolic pressures. The patient was advised to get cardiac catheterization done, which was performed today, on the 23rd of February 2015. It was felt that the patient would benefit from cardio-surgical evaluation for aortic valve replacement versus TAVR. The patient is being discharged to [facility] for further treatment of her condition. In regards to suspected colitis as well as urinary tract infection, the patient is to continue antibiotic therapy for 3 more days to complete a 7 day course.   Of note, the patient's vital signs: Temperature was 97.8, pulse was 84, respiration rate was 18, blood pressure 154/81, and saturation was 97% on 3 liters of oxygen through nasal cannula at rest.   TIME SPENT: 40 minutes.  ____________________________ Katharina Caper, MD rv:sb D: 12/29/2013 10:43:42 ET T: 12/29/2013 11:16:32 ET JOB#: [id number]  cc: Katharina Caper, MD, <Dictator> Cielle Aguila MD ELECTRONICALLY SIGNED 01/07/2014  21:21

## 2015-02-27 NOTE — Discharge Summary (Signed)
PATIENT NAME:  [name], [name] MR#:  [id number] DATE OF BIRTH:  1931-04-10  DATE OF ADMISSION:  02/15/2014 DATE OF DISCHARGE:    PRIMARY CARE PHYSICIAN: Kandyce Rud, MD  CHIEF COMPLAINT: Generalized weakness and lethargy.   DISCHARGE DIAGNOSES: 1.  Metabolic encephalopathy, resolved.  2.  Urinary tract infection.  3.  Dementia.  4.  History of coronary artery disease, status post coronary artery bypass grafting. 5.  Severe aortic stenosis.  6.  History of supraventricular tachycardia, status post ablation.  7.  Urinary incontinence.  8.  History of degenerative joint disease with bilateral knee replacements.  9.  History of hyperlipidemia.  10.  Hypertension.  11.  History of polio.   DISCHARGE MEDICATIONS: Keflex 500 mg 2 times a day for 5 days, amlodipine 5 mg daily, Exelon patch 9.5 mg per 24 hours once a day, Colace 100 mg 1 cap 2 times a day, losartan 50 mg once a day, citalopram 20 mg once a day, Celebrex 200 mg once a day, Toviaz 8 mg extended-release once a day, aspirin 81 mg daily, metoprolol succinate 100 mg extended-release once a day.   DISPOSITION: To rehab.   DIET: Low-sodium.   ACTIVITY: As tolerated.   FOLLOWUP: Please follow up with PCP within 1 to 2 weeks.   CODE STATUS: The patient is full code.   SIGNIFICANT LABORATORIES AND IMAGING: Initial chest x-ray, one view: Stable cardiomegaly and pulmonary scarring. Abdominal flat and erect: No evidence of bowel obstruction, moderate amount of stool throughout the colon. Blood cultures negative from April 12. Initial white count of 14.3, last white count of 11.4. Initial troponin negative. LFTs on arrival: AST was 44, otherwise within normal limits.   HISTORY OF PRESENT ILLNESS AND HOSPITAL COURSE: For full details of H and P, please see the dictation on April 13 by Dr. Randol Kern but briefly, this is a pleasant 81 year old with dementia, CAD, severe aortic stenosis, who has been worked up at [facility] for her severe  aortic stenosis but the patient refused surgery. She came in for generalized weakness and lethargy. The patient had been seen by PCP last Thursday before admission, where she was diagnosed with a UTI and prescribed Bactrim. The patient of note had not started taking it until the day prior to admission, and she came in with lethargy, confusion, obtunded, and with poor p.o. intake. She had mild leukocytosis, and she had a dirty urinalysis. Blood cultures were sent, and she was admitted to the hospitalist service with some IV fluids and ceftriaxone. The patient's urine culture initially was not sent before antibiotic therapy. With ceftriaxone IV with IV fluids, the patient's metabolic encephalopathy has resolved. The patient is eating, engaging in conversation, and is awake, alert. As the patient has improved with ceftriaxone, she will be discharged with Keflex for 5 more days. Today is her fourth day of IV ceftriaxone. Blood cultures have been negative, and white count has come down with the ceftriaxone. THE PATIENT, ALTHOUGH HAS ALLERGY TO PENICILLIN, APPEARS TO HAVE TOLERATED THE CEPHALOSPORIN. She was seen by physical therapy, and the recommendation was acute rehab, and she will be getting discharged there. While hospitalized, we did hold her blood pressure medications for some transient hypotension, which has resolved.  The patient is full code.   TOTAL TIME SPENT: About 35 minutes.    ____________________________ Krystal Eaton, MD sa:jcm D: 02/18/2014 14:53:42 ET T: 02/18/2014 15:17:21 ET JOB#: [id number]  cc: Krystal Eaton, MD, <Dictator> Kandyce Rud, MD Krystal Eaton MD ELECTRONICALLY  SIGNED 03/05/2014 14:27

## 2015-02-27 NOTE — Consult Note (Signed)
General Aspect 81 y/o F with h/o CAD s/p CABG in 1995 (LIMA->LAD, SVG->OM3, SVG->RPDA), SVT s/p RFA at [facility], severe aortic stenosis (see below), HTN, HLD, urinary incontinence and DJD who presented to [facility] ED 05/19/2014 after sustaining a fall,  UTI, NSTEMI, now presenting from SNF with SOB. __________________________   Present Illness 81 y/o F with above hx who presents with SOB. patient not able to provide a detailed hx.   Previous evaluation at [facility] for severe aortic stenosis (mean gradient 59 mm Hg, peak gradient 86 mm Hg.)  Seen by Dr. Roxy Manns and Dr. Copper and they both agreed that she would not be a good candidate for open replacement of her aortic valve or TAVR. Both the patient and her husband understood and were in agreement.   Last echo: 12/31/2013 with an EF 20-25%. Systolic function was severely reduced. The diffuse HK. There was an increased relative contribution of atrial contraction to ventricular filling. Aortic valve: There was severe stenosis. Mild regurgitation. Valve area: 0.25cm^2(VTI). Valve area: 0.27cm^2 (Vmax). Mitral valve: Mild regurgitation. Tricuspid valve: Moderate regurgitation. Pulmonary arteries: PA peak pressure: 86mm Hg (S).   Last cath: 12/29/2013: EF 40%. LM normal. pLAD 60% diffuse stenosis, , mLAD 100% stenosis; 1st diag 60%; mCx 90%, dCx 100%, OM1 small vessel, OM2 80% prox; RCA 100% prox; graft to LAD LIMA no dz, SVG->OM3 minor irregs, SVG->RPDA minor irregs.   Physical Exam:  GEN well developed, uncomfortable appearing   HEENT hearing intact to voice, moist oral mucosa   NECK supple   RESP normal resp effort  clear BS   CARD Regular rate and rhythm  Murmur   Murmur Systolic  4/6 harsh   ABD denies tenderness  soft  normal BS   LYMPH negative neck   EXTR negative edema   SKIN normal to palpation   NEURO cranial nerves intact, motor/sensory function intact   PSYCH alert, demented   Review of Systems:  Review of Systems: All other systems  were reviewed and found to be negative   ROS Pt not able to provide ROS   Medications/Allergies Reviewed Medications/Allergies reviewed   Family & Social History:  Family and Social History:  Family History Coronary Artery Disease   Social History negative tobacco, negative ETOH, negative Illicit drugs   Place of Living Nursing Home     MI:    Dementia: advanced, per chart notes   POLIO:    hypertension:    right TKR:    left total knee [id number]:    Arthroscopy left knee: 2007   Cervical disk fusion: 2007   cholecystectomy: 2005   Bunion surgery right foot: 1995   Carpal tunnel release - both: 1986   Tonsillectomy: 1965   hysterectomy: 1975   CABG: 1998       Admit Diagnosis:   CHF AORTIC STENOSIS: Onset Date: 08-Jun-2014, Status: Active, Description: CHF AORTIC STENOSIS  Home Medications: Medication Instructions Status  losartan 50 mg oral tablet 1 tab(s) orally once a day Active  Metoprolol Succinate ER 100 mg oral tablet, extended release 1 tab(s) orally once a day Active  citalopram 20 mg oral tablet 1 tab(s) orally once a day Active  atorvastatin 20 mg oral tablet 1 tab(s) orally once a day (at bedtime) Active  Exelon 9.5 mg/24 hr transdermal film, extended release 1 patch transdermal once a day. *remove old patch* Active  aspirin 81 mg oral tablet, chewable 1 tab(s) orally once a day Active  Mapap Arthritis Pain 650 mg oral  tablet, extended release 1 tab(s) orally 3 times a day. *do not crush* Active  LORazepam 0.5 mg oral tablet 0.5 tab (0.$RemoveBe'25mg'tVAcYcvFk$ ) orally every 4 hours as needed for agitation/anxiety,  Active  ranitidine 150 mg oral tablet 1 tab(s) orally 2 times a day as needed Active  morphine 20 mg/mL oral concentrate 0.25 to 0.5 milliliter(s) orally every hour as needed for chest pain/dyspnea. Active  nitroglycerin 0.4 mg sublingual tablet 1 tab(s) sublingual every 5 minutes up to 3 doses as needed for chest pain. *if no relief call md or go to  emergency room* Active   Lab Results:  Routine Chem:  02-Aug-15 04:39   Glucose, Serum  109  BUN  21  Creatinine (comp) 1.01  Sodium, Serum 142  Potassium, Serum  3.4  Chloride, Serum 105  CO2, Serum 29  Calcium (Total), Serum 9.2  Anion Gap 8  Osmolality (calc) 287  eGFR (African American) >60  eGFR (Non-African American)  53 (eGFR values <24mL/min/1.73 m2 may be an indication of chronic kidney disease (CKD). Calculated eGFR is useful in patients with stable renal function. The eGFR calculation will not be reliable in acutely ill patients when serum creatinine is changing rapidly. It is not useful in  patients on dialysis. The eGFR calculation may not be applicable to patients at the low and high extremes of body sizes, pregnant women, and vegetarians.)  Cardiac:  01-Aug-15 13:26   Troponin I  0.11 (0.00-0.05 0.05 ng/mL or less: NEGATIVE  Repeat testing in 3-6 hrs  if clinically indicated. >0.05 ng/mL: POTENTIAL  MYOCARDIAL INJURY. Repeat  testing in 3-6 hrs if  clinically indicated. NOTE: An increase or decrease  of 30% or more on serial  testing suggests a  clinically important change)    16:48   Troponin I  0.10 (0.00-0.05 0.05 ng/mL or less: NEGATIVE  Repeat testing in 3-6 hrs  if clinically indicated. >0.05 ng/mL: POTENTIAL  MYOCARDIAL INJURY. Repeat  testing in 3-6 hrs if  clinically indicated. NOTE: An increase or decrease  of 30% or more on serial  testing suggests a  clinically important change)    20:57   Troponin I  0.10 (0.00-0.05 0.05 ng/mL or less: NEGATIVE  Repeat testing in 3-6 hrs  if clinically indicated. >0.05 ng/mL: POTENTIAL  MYOCARDIAL INJURY. Repeat  testing in 3-6 hrs if  clinically indicated. NOTE: An increase or decrease  of 30% or more on serial  testing suggests a  clinically important change)  Routine Hem:  02-Aug-15 04:39   WBC (CBC)  16.0  RBC (CBC) 4.91  Hemoglobin (CBC) 14.2  Hematocrit (CBC) 44.4  Platelet Count  (CBC) 266  MCV 90  MCH 29.0  MCHC 32.1  RDW  15.0  Neutrophil % 84.4  Lymphocyte % 8.1  Monocyte % 5.9  Eosinophil % 0.8  Basophil % 0.8  Neutrophil #  13.5  Lymphocyte # 1.3  Monocyte #  1.0  Eosinophil # 0.1  Basophil # 0.1 (Result(s) reported on 07 Jun 2014 at 05:38AM.)   EKG:  Interpretation EKG shows NSR with rate 84 bpm, T wave ABN in anterolateral leads   Radiology Results: XRay:    01-Aug-15 13:55, Chest Portable Single View  Chest Portable Single View   REASON FOR EXAM:    Sepsis  COMMENTS:       PROCEDURE: DXR - DXR PORTABLE CHEST SINGLE VIEW  - Jun 06 2014  1:55PM     CLINICAL DATA:  Chest pain, recent heart attack, sepsis  EXAM:  PORTABLE CHEST - 1 VIEW    COMPARISON:  CT chest dated 05/22/2014.    FINDINGS:  Cardiomegaly with suspected mild interstitial edema. Small right  pleural effusion. No pneumothorax.  Postsurgical changes related to prior CABG.     IMPRESSION:  Cardiomegaly with mild interstitial edema.    Small right pleural effusion.      Electronically Signed    By: Julian Hy M.D.    On: 06/06/2014 14:22         Verified By: Julian Hy, M.D.,    Isosorbide Mononitrate: Rash  Penicillin: Rash  Vital Signs/Nurse's Notes: **Vital Signs.:   03-Aug-15 05:32  Vital Signs Type Routine  Celsius 36.5  Temperature Source oral  Pulse Pulse 71  Respirations Respirations 19  Systolic BP Systolic BP 548  Diastolic BP (mmHg) Diastolic BP (mmHg) 83  Mean BP 106  Pulse Ox % Pulse Ox % 91  Pulse Ox Activity Level  At rest  Oxygen Delivery Room Air/ 21 %    Impression 81 y/o F with CAD s/p CABG in 1995 (LIMA->LAD, SVG->OM3, SVG->RPDA), SVT s/p RFA at [facility], severe aortic stenosis, HTN, HLD, and urinary incontinence who presented to [facility] ED 05/19/2014 after sustaining a fall, UTI, NSTEMI,  presenting with SOB.   Plan 1) Systolic CHF in setting of EF 20 to 25%, severe aortic valve stenosis. --she appears comfortable after  2 days of diuesis, no complaints this Am. High risk of recurrent CHF sx given low EF and aortic valve stenosis --would continue on lasix 20 mg po BID at discharge, weight scale, increasing lasix for 3 pound weight gain, hold lasix for weight loss concerning for dehydration  2. Severe aortic stenosis:  previous full evaluation with both Dr. Roxy Manns and Copper at [facility], not be a candidate for open repair of her aortic valve or TAVR 2/2 to her dementia.  She was doing well from a functional state at home prior per the husband.   3.acute cystitis:   Per IM.  climb in WBC today  4. Abdominal pain:  resolved,   Electronic Signatures: Ida Rogue (MD)  (Signed 03-Aug-15 09:10)  Authored: General Aspect/Present Illness, History and Physical Exam, Review of System, Family & Social History, Past Medical History, Health Issues, Home Medications, Labs, EKG , Radiology, Allergies, Vital Signs/Nurse's Notes, Impression/Plan   Last Updated: 03-Aug-15 09:10 by Ida Rogue (MD)

## 2015-02-27 NOTE — H&P (Signed)
PATIENT NAME:  [name], [name] MR#:  [id number] DATE OF BIRTH:  [birth year]  DATE OF ADMISSION:  02/16/2014  REFERRING PHYSICIAN: Kathreen Devoid. Paduchowski, MD   PRIMARY CARE PHYSICIAN: Kandyce Rud, MD    CHIEF COMPLAINTS: Generalized weakness and lethargy.   HISTORY OF PRESENT ILLNESS: This is an 81 year old female with history of hypertension, hyperlipidemia, coronary artery disease, status post CABG, also history of severe aortic stenosis, who has been worked up at [facility] for that, where the patient has been refusing surgery. The patient presents with generalized weakness and lethargy. The patient has history of advanced dementia. Husband at bedside gives most of the history. Reports the patient was seen by PCP last Thursday where she was diagnosed with UTI, prescribed p.o. antibiotics, but reports she had not started taking it till yesterday. Reports yesterday she was lethargic, obtunded, not able to take p.o. intake, not able to stand from the bed where she usually ambulates with assistance with a walker, which prompted him to bring her to the ED. In ED, patient's work-up was significant for mild leukocytosis at 14,000. Her urinalysis is positive for UTI. A chest x-ray does not show any acute disease. Hospitalists were requested to admit the patient for further treatment and hydration. Husband reports patient felt warm earlier today, but he did not check her temperature. The patient was afebrile here. The patient is pretty confused and she cannot give a reliable history.   PAST MEDICAL HISTORY:  1. Coronary artery disease status post CABG.  2. Hypertension.  3. Hyperlipidemia.  4. History of polio.  5. Degenerative joint disease with bilateral knee replacement.  6. History of supraventricular tachycardia status post ablation.  7. Urinary incontinence.  8. Severe aortic stenosis.   PAST SURGICAL HISTORY:  1. Hysterectomy.  2. Gallbladder surgery.  3. Bunion surgery.  4. Coronary artery  bypass grafting.  5. Cervical disc fusion.  6. Carpal tunnel release on both sides.  7. Tonsillectomy.   ALLERGIES:  IMDUR AND PENICILLIN.   MEDICATIONS:  1. Toviaz 8 mg once daily.  2. Metoprolol succinate 100 mg p.o. daily.   3. Losartan 100 mg oral daily.  4. Aspirin 81 mg daily.  5. Celebrex 200 mg 1 oral daily.  6. Citalopram 20 mg oral daily.  7. Losartan 50 mg oral daily.  8. Centrum Silver 1 tablet oral daily.  9. Citracal with calcium 1 tablet daily.  10. Norvasc 10 mg daily.  11. Exelon patch 9.5 mg daily.  12. Colace 100 mg oral 2 times a day.  13. Patient was started yesterday on antibiotic. Husband cannot recall the name.    REVIEW OF SYSTEMS: The patient and has advanced dementia and confused and lethargic. Cannot give reliable review of systems.   FAMILY HISTORY: Significant for coronary artery disease.   SOCIAL HISTORY: No history of smoking, drinking alcohol, or illicit drug use. Lives at home with her husband.   PHYSICAL EXAMINATION:  VITAL SIGNS: Temperature 97.8, pulse 86, respiratory rate 18, blood pressure 140/78, 79% on room air.  GENERAL: Elderly frail female, chronically ill-appearing, in no apparent distress.  HEENT: Head atraumatic, normocephalic. Pupils equal, reactive to light. Pink conjunctivae. Anicteric sclerae. Dry oral mucosa.  NECK: Supple. No thyromegaly. No JVD.  CHEST: Good air entry bilaterally. No wheezing, rales, rhonchi.  CARDIOVASCULAR: S1, S2 heard. Has systolic ejection murmur 4/6. No rubs, no gallops.  ABDOMEN: Soft, nontender, nondistended. Bowel sounds present.  EXTREMITIES: No edema. No clubbing. No cyanosis.  PSYCHIATRIC: The patient  is awake, communicative, confused. She knows she is in a hospital and she answers to name.  She does not know the date.  She cannot answer most of the questions appropriately.  NEUROLOGIC:  Cranial nerves grossly intact. Motor: Appears to be moving all extremities without focal deficits.  SKIN:  decreased  skin turgor, dry.  LYMPHATIC: No cervical lymphadenopathy.  MUSCULOSKELETAL: No joint effusion or erythema could be appreciated.   PERTINENT LABORATORY DATA: Glucose 98, BUN 15, creatinine 0.49, sodium 136, potassium 4.6, chloride 102. Troponin less than 0.02. White blood cells 14.3, hemoglobin 15.8, hematocrit 49.1, platelets 203,000. Urinalysis showing +1 leukocyte esterase and 18 white blood cells.   RADIOLOGICAL DATA: Chest x-ray does not show any acute findings.   ASSESSMENT AND PLAN:  1. Generalized weakness and lethargy, altered mental status, this is most likely related to patient's urinary tract infection and dehydration. She will be kept n.p.o. until she is more awake and where she is able to take p.o. intake. We will continue with hydration. We will treat her urinary tract infection.  2. Urinary tract infection. The patient will be started on Rocephin. Follow on urine culture.  3. Dementia. We will continue with Exelon patch on supportive care.  4. History of coronary artery disease. Continue with aspirin, beta blockers, and losartan.  5. Hypertension. Blood pressure acceptable. Continue with home medication.  6. History of aortic stenosis. The patient has been evaluated at [facility] and she did not want any surgery to be done.   Patient's husband at bedside reports she has a living will. He is her healthcare power of attorney, and she is a full code.   TOTAL TIME SPENT ON ADMISSION AND PATIENT CARE: 55 minutes.      ____________________________ Starleen Arms, MD dse:dd D: 02/16/2014 00:57:16 ET T: 02/16/2014 01:20:15 ET JOB#: [id number]  cc: Starleen Arms, MD, <Dictator> Alexius Ellington Teena Irani MD ELECTRONICALLY SIGNED 02/17/2014 2:59

## 2015-02-27 NOTE — Discharge Summary (Signed)
PATIENT NAME:  ESSANCE, MCSPADDEN MR#:  [id number] DATE OF BIRTH:  1931/07/17  DATE OF ADMISSION:  06/06/2014 DATE OF DISCHARGE:  06/09/2014  PRIMARY CARE PHYSICIAN:  Kandyce Rud, MD.  PRIMARY CARDIOLOGIST:  Antonieta Iba, MD.  PALLIATIVE CARE:  Ned Grace, MD.   CHIEF COMPLAINT AT THE TIME OF ADMISSION:  Shortness of breath.  ADMITTING DIAGNOSES:  1. Acute respiratory distress from congestive heart failure.  2.  Elevated troponin.   PRIMARY DISCHARGE DIAGNOSES:  1.  Acute on chronic systolic congestive heart failure.  2.  Elevated troponin from demand ischemia.  3.  Acute respiratory failure, resolved.   CODE STATUS: Do not resuscitate.   CONSULTATIONS:   1.  Cardiology constant is placed to Dr. Mariah Milling.  2.  Palliative care to Dr. Harvie Junior.   PROCEDURES: None.   BRIEF HISTORY OF PRESENT ILLNESS AND HOSPITAL COURSE: The patient is an 81 year old female, came into the ED with a chief complaint of shortness of breath from skilled nursing facility. Please review H and P for details. The patient recently had a non-STEMI. The patient was treated medically during the hospital course.  As the patient was complaining of shortness of breath, she was brought into the ED.  She was diagnosed with acute exacerbation of CHF and she was admitted to the hospital. The patient was given IV Lasix and also she was initially placed on BiPAP. As the patient was doing much better, BiPAP was weaned off and the patient was placed on oxygen via nasal cannula.   During the hospital course, with IV Lasix, her shortness of breath got significantly resolved. The patient being demented and being a poor historian, I was unable to get much history from the patient, but clinically, she improved well. Oxygen was weaned off. The patient was evaluated by cardiology, Dr. Mariah Milling who has recommended to discharge her with Lasix 20 mg p.o. twice a day with potassium supplements. The patient was not on any Lasix at the nursing  facility. Elevated troponin was thought to be from demand ischemia from congestive heart failure. The patient's hospital course was uneventful.   As the patient was constipated with no bowel movement for 3 days, she was given laxatives and enema. Subsequently, she had a good amount of bowel movement.   Acute respiratory failure, secondary to acute on chronic systolic congestive heart failure was resolved. Ejection fraction was 20% to 25%.   For dementia, plan is to continue Exelon patch. Also, as the patient has history of coronary artery disease and recent non-STEMI, the patient is to continue aspirin, beta blockers, losartan and statin.    CODE STATUS:  Do not resuscitate.  CONDITION: At the time of discharge to a skilled nursing facility is stable.   DISPOSITION: Skilled nursing facility.  DISCHARGE MEDICATIONS: 1.  Metoprolol succinate 100 mg p.o. once daily. 2.  Citalopram 20 mg once daily. 3.  Atorvastatin 20 mg once daily at bedtime. 4.  Losartan 50 mg p.o. once daily.  5.  Exelon patch transdermally once a day. 6.  Aspirin 81 mg once daily. 7.  MAPAP arthritis 1 tablet p.o. 3 times a day  8.  Lorazepam 0.5 mg 1/2 tablet orally every 4 hours as needed for agitation and anxiety. 9.  Dilantin 150 mg 1 tablet p.o. b.i.d. as needed. 10.  Morphine 0.2-0.5 mL p.o. every hour as needed for chest pain and dyspnea.  11.  Nitroglycerin 0.4 mg sublingually every 5 minutes up to 3 doses as needed for  chest pain. 12.  Tylenol 325 mg 2 tablets p.o. 4 hours as needed for pain. 13.  Ensure 240 mL p.o. 2 times a day. 14.  Furosemide 20 mg 1 tablet p.o. 2 times a day. 15.  Klor-Con 20 mEq p.o. once daily. 16.  Milk of magnesia 15 mL p.o. once a day, after meals.  DIET:  Low-fat, low-cholesterol.   ACTIVITY: As tolerated.   FOLLOWUP:  Followup appointment, Dr. Larwance Sachs, primary care physician in 2-3 days, Dr. Mariah Milling in 2 weeks.  Of note, the patient was evaluated by palliative care. As the  patient does not have a good supportive care at home, decision is made to transfer the patient to skilled nursing facility.   SIGNIFICANT LABS AND IMAGING STUDIES: Glucose 112, BUN 21, creatinine 0.75 sodium is 137, potassium 3.1 repleted with potassium supplement. GFR is greater than 60. Troponin 0.11, 0.10 and 0.10, hemoglobin 15.8, hematocrit is 49.2. Blood cultures have revealed no growth.   URINALYSIS: Nitrites and leukocyte esterase are negative   RADIOLOGY:  Chest x-ray, PA and lateral views: Has revealed stable cardiomegaly, small pleural effusions, similar  interstitial prominence, which may reflect pulmonary edema or COPD.  Echocardiogram was not repeated as the patient had a recent echocardiogram which has revealed 20% to 25% of ejection fraction.  TOTAL TIME SPEND ON DISCHARGE:  45 minutes.  Prescriptions were given for morphine and lorazepam.     ____________________________ Ramonita Lab, MD ag:ds D: 06/09/2014 14:58:47 ET T: 06/09/2014 15:45:07 ET JOB#: [id number]  cc: Ramonita Lab, MD, <Dictator> Kandyce Rud, MD Antonieta Iba, MD Ramonita Lab MD ELECTRONICALLY SIGNED 06/18/2014 8:12

## 2015-02-27 NOTE — Consult Note (Signed)
   Comments   Meeting with husband, Dorene Sorrow, about goals of medical therapy done.  Husband states that after this hospitalization pt is to return to [facility] to finish rehab.  After this pt is to stay at [facility] for skilled nurisin as Dorene Sorrow states he could not take care of wife at home or private caregivers. is pleasantly confused in room and is A/O x person.    Electronic Signatures: Reather Laurence (NP)  (Signed 03-Aug-15 12:42)  Authored: Palliative Care   Last Updated: 03-Aug-15 12:42 by Reather Laurence (NP)

## 2015-02-27 NOTE — H&P (Signed)
PATIENT NAME:  [name], [name] MR#:  [id number] DATE OF BIRTH:  05-Jun-1931  DATE OF ADMISSION:  06/06/2014  PRIMARY CARE PHYSICIAN: Dr. Larwance Sachs.   CHIEF COMPLAINT: Shortness of breath.   HISTORY OF PRESENT ILLNESS: This is an 81 year old female who presents to the hospital from a skilled nursing facility due to shortness of breath. The patient, herself, cannot give a history; therefore, most of the history obtained from the husband at bedside. The patient was just recently discharged to a skilled nursing facility about a week or so ago. She has been very lethargic and nonverbal, as per the husband, over there and her oral intake has been poor. She recently had a non-ST-elevation myocardial infarction and was treated medically here in the hospital. The patient was also noted to have critical aortic stenosis which is currently being managed supportively as she is not a great surgical candidate. This morning, she went into the bathroom and was complaining of some vague chest/abdominal pain. She was then noted to be significantly short of breath and sent over to the ER. She was noted to be hypoxic and noted to be in congestive heart failure. Hospitalist services were contacted for further treatment and evaluation.   REVIEW OF SYSTEMS:  CONSTITUTIONAL: No documented fever. No weight gain, no weight loss.  EYES: No blurry or double vision.  EARS, NOSE, AND THROAT: No tinnitus. No postnasal drip. No redness of the oropharynx.  RESPIRATORY: No cough. No wheeze. No hemoptysis. Positive dyspnea.  CARDIOVASCULAR: No chest pain, no orthopnea, no palpitations, no syncope.  GASTROINTESTINAL: No nausea, vomiting, or diarrhea,  No abdominal pain. No melena or hematochezia.  GENITOURINARY: No dysuria or hematuria.  ENDOCRINE: No polyuria or nocturia. No heat or cold intolerance.  HEMATOLOGIC: No anemia, no bruising, no bleeding.  INTEGUMENTARY: No rashes or lesions.  MUSCULOSKELETAL: No arthritis. No swelling. No  gout.  NEUROLOGIC: No numbness or tingling. No ataxia. No seizure-type activity.  PSYCHIATRIC: No anxiety, no insomnia or ADD.   PAST MEDICAL HISTORY: Consistent with critical aortic valve stenosis. Recent non-ST elevation MI, dementia, recent urinary tract infection due to Proteus, and GERD.   ALLERGIES: IMDUR AND PENICILLIN.   SOCIAL HISTORY: No smoking. No alcohol abuse. No illicit drug abuse. Currently resides at a skilled nursing facility.   FAMILY HISTORY: Mother and father are both deceased. Both died from complications of myocardial infarction.   CURRENT MEDICATIONS: As follows: Aspirin 81 mg daily, atorvastatin 20 mg at bedtime, Celexa 20 mg daily, Exelon patch transdermal daily, lorazepam 0.5 mg q. 4 hours as needed, losartan 50 mg daily, Tylenol arthritis 650, Butler.i.d., metoprolol succinate 100 mg daily, Roxanol 0.25 to 0.5 mL every 4 hours as needed, sublingual nitroglycerin as needed, and ranitidine 150 mg b.i.d.   PHYSICAL EXAMINATION: Presently is as follows:  VITAL SIGNS:  Noted to be temperature 97.5, pulse 76, respirations 27, blood pressure 162/85, saturation 98% on BiPAP.  GENERAL: She is a pleasant -appearing female, lethargic, in mild respiratory distress.  HEENT: She is atraumatic, normocephalic. Her extraocular muscles are intact. Pupils equal and reactive to light. Sclerae anicteric. No conjunctival injection. No pharyngeal erythema.  NECK: Supple. There is no jugular venous distention. No bruits, no lymphadenopathy, no thyromegaly.  HEART: Regular rate and rhythm. She is have a loud 2/6 to 3/6 systolic ejection murmur heard at the right sternal border, radiation to the carotids.  No rubs, no clicks.  LUNGS: She has some bibasilar crackles. Negative use of accessory muscles. No dullness to percussion.  ABDOMEN: Soft, flat, nontender, nondistended. Has good bowel sounds. No hepatosplenomegaly appreciated.  EXTREMITIES: No diffuse cyanosis, clubbing, or peripheral edema.  Has +2 pedal and radial pulses bilaterally.  NEUROLOGICAL: The patient is alert, awake, and oriented x 1. Globally weak. Moves all extremities spontaneously. No other focal, motor or sensory deficits is appreciated bilaterally.  SKIN: Moist and warm with no rashes appreciated.  LYMPHATIC: There is no cervical or axillary adenopathy.   LABORATORY EXAM: Serum glucose of 138, BNP of 65,000, BUN 20, creatinine 1.1, sodium 141, potassium 3.9, chloride 108, bicarbonate 24, albumin is 2.9, troponin 0.1. White cell count 12.1, hemoglobin 14.3, hematocrit 45.1, platelet count 275,000. INR is 1.2.  Urinalysis shows no evidence of any acute UTI.   The patient did have an arterial blood gas which showed a pH of 7.45, pCO2 of 34, pO2 of 91, saturations are 96%.   ASSESSMENT AND PLAN: This is an 81 year old female with past medical history of dementia, critical aortic valve stenosis, recent non-ST elevation myocardial infarction, recent urinary tract infection, and gastroesophageal reflux disease who presented to the hospital with shortness of breath and noted to be in congestive heart failure.  1.  Congestive heart failure. This is likely the cause of patient's acute shortness of breath.  This is likely acute on chronic diastolic dysfunction. The patient does have significant risk factors to be in heart failure given her recent non-ST-elevation myocardial infarction and also critical aortic valve stenosis. I will diuresis her with IV Lasix, follow I's and O's, and daily weights. I think she can likely be weaned off BiPAP.  We will place her on supportive oxygen for now. Continue her beta blocker and her losartan for now.  2.  Elevated troponin.  This is likely in the setting of demand ischemia from the congestive heart failure. I will cycle her cardiac markers, observe her on telemetry.   3.  Dementia. Continue with the Exelon patch.   4.  Depression. Continue Celexa.  5.  History of recent non-ST elevation  myocardial infarction. She is currently chest pain-free.  I will continue her aspirin, beta blocker, losartan, and statin.   6.  CODE STATUS: The patient is a DO NOT INTUBATE/DO NOT RESUSCITATE.   TIME SPENT ON ADMISSION: 50 minutes.    ____________________________ Rolly Pancake. Cherlynn Kaiser, MD vjs:ts D: 06/06/2014 15:29:53 ET Butler: 06/06/2014 15:40:34 ET JOB#: [id number]  cc: Rolly Pancake. Cherlynn Kaiser, MD, <Dictator> Houston Siren MD ELECTRONICALLY SIGNED 06/19/2014 14:24

## 2015-02-27 NOTE — Discharge Summary (Signed)
Dates of Admission and Diagnosis:  Date of Admission 22-May-2014   Date of Discharge 26-May-2014   Admitting Diagnosis Abdominal pain   Final Diagnosis 1. NSTEMI 2. Severe aortic stenosis 3. proteus UTI 4. Sepsis 5. Dementia 6. Hypokalemia    Chief Complaint/History of Present Illness CHIEF COMPLAINT: Generalized weakness, lower abdominal pain and low-grade fever.   HISTORY OF PRESENT ILLNESS: The patient is an 81 year old Caucasian female who was seen and evaluated in the ED yesterday after she sustained a fall. She was diagnosed with urinary tract infection. She was given 1 dose of IV Rocephin and she was discharged home with p.o. Bactrim. The patient woke up this morning and needed to use the restroom and was complaining of lower abdominal pain to her husband. She was extremely weak and unable to get out of the bed without any assistance. As the patient was having difficulty in getting out of the bed and complaining of abdominal pain husband has called EMS and she was sent over to the ED. According to the ER staff, the patient answered a few questions, opens her eyes and smiles, states no abdominal pain, but very uncomfortable with abdominal palpation. I was unable to get any history from the patient. The patient just nods her head, but not talking. No family members at bedside. CT of the head and cervical spine were ordered, which revealed no acute findings. CT of abdomen is ordered, which is pending at this time. The patient was tachycardic and tachypneic, and has a temp of 99.1. IV Rocephin was given in the ED last night.   Allergies:  Isosorbide Mononitrate: Rash  Penicillin: Rash  LabObservation:  18-Jul-15 09:25   OBSERVATION Reason for Test  Routine Micro:  17-Jul-15 08:18   Micro Text Report URINE CULTURE   COMMENT                   NO GROWTH IN 18-24 HOURS   ANTIBIOTIC                       Specimen Source IN/OUT CATH  Culture Comment NO GROWTH IN 18-24 HOURS   Result(s) reported on 23 May 2014 at 11:39AM.  Cardiology:  18-Jul-15 09:25   Echo Doppler REASON FOR EXAM:     COMMENTS:     PROCEDURE: [facility] - ECHO DOPPLER COMPLETE(TRANSTHOR)  - May 23 2014  9:25AM   RESULT: Echocardiogram Report  Patient Name:   [name] Date of Exam: 05/23/2014 Medical Rec #:  [id number]       Custom1: Date of Birth:  1931/10/11   Height:       61.0 in Patient Age:    76 years     Weight:       107.0 lb Patient Gender: F            BSA:          1.45 m??  Indications: Murmur Sonographer:    Janalee Dane RCS Referring Phys: Nicholes Mango  Summary:  1. Severe aortic valve stenosis.Peak velocity 480 cm/sec, peak gradient  91 mm Hg.  2. Left ventricular ejection fraction, by visual estimation, is 45 to  50%.  3. Low normal global left ventricular systolic function.  4. Mild left ventricular hypertrophy.  5. Normal right ventricular size and systolic function.  6. Mildly thickened mitral valve with MAC.  7. Mild mitral valve regurgitation.  8. Mild to moderate aortic regurgitation.  9. Mild to moderate tricuspid regurgitation.  10. Mildly elevated pulmonary artery systolic pressure. 2D AND M-MODE MEASUREMENTS (normal ranges within parentheses): Left Ventricle:          Normal IVSd (2D):      1.39 cm (0.7-1.1) LVPWd (2D):     1.10 cm (0.7-1.1) Aorta/LA:                  Normal LVIDd (2D):     3.99 cm (3.4-5.7) Aortic Root (2D): 2.30 cm (2.4-3.7) LVIDs (2D):     3.18 cm           Left Atrium (2D): 3.60 cm (1.9-4.0) LV FS (2D):     20.3 %   (>25%) LV EF (2D):     42.0 %   (>50%)                                   Right Ventricle:                                   RVd (2D):        2.35 cm LV SYSTOLIC FUNCTION BY 2D PLANIMETRY (MOD): EF-A4C View: 42.1 % EF-A2C View: 40.0 % EF-Biplane: 40.9 % SPECTRAL DOPPLER ANALYSIS (where applicable): Aortic Valve: AoV Max Vel: 4.77 m/s AoV Peak PG: 90.8 mmHg AoV Mean PG:  60.5 mmHg LVOT Vmax: 0.73 m/s LVOT VTI: 0.139 m  LVOT Diameter: 2.00 cm AoV Area, Vmax: 0.48 cm?? AoV Area, VTI: 0.39 cm?? AoV Area, Vmn: 0.39 cm?? Aortic Insufficiency: AI Half-time:  366 msec AI Decel Rate: 3.18 m/s?? Tricuspid Valve and PA/RV Systolic Pressure: TR Max Velocity: 2.84 m/s RA  Pressure: 5 mmHg RVSP/PASP: 37.2 mmHg  PHYSICIAN INTERPRETATION: Left Ventricle: The left ventricular internal cavity size was normal. LV  posterior wall thickness was normal. Mild left ventricular hypertrophy.  Global LV systolic function was low normal. Left ventricular ejection  fraction, by visual estimation, is 45 to 50%. Right Ventricle: Normal right ventricular size, wall thickness, and  systolic function. The right ventricular size is normal. Global RV   systolic function is normal. Left Atrium: The left atrium is normal in size. Right Atrium: The right atrium is normal in size. Pericardium: There is no evidence of pericardial effusion. Mitral Valve: The mitral valve is degenerative in appearance. Mild mitral  valve regurgitation is seen. Tricuspid Valve: The tricuspid valve is normal. Mild tricuspid  regurgitation is visualized. The tricuspid regurgitant velocity is 2.84  m/s, and with an assumed right atrial pressure of 5 mmHg, the estimated  right ventricular systolic pressure is mildly elevated at 37.2 mmHg. Aortic Valve: The aortic valve was not well seen. Severe aortic stenosis  is present. Mild to moderate aortic valve regurgitation is seen. Pulmonic Valve: The pulmonic valve is normal. Trace pulmonic valve  regurgitation. Aorta: The aortic root and ascending aorta are structurally normal, with   no evidence of dilitation.  [id number] Ida Rogue MD Electronically signed by [id number] Ida Rogue MD Signature Date/Time: 05/24/2014/11:00:13 AM  *** Final ***  IMPRESSION: .    Verified By: Minna Merritts, M.D., MD  Routine Chem:  17-Jul-15 07:37   Result Comment Troponin - RESULTS VERIFIED BY REPEAT TESTING.  -  Elevated troponin called to and read  - back by Earnest Bailey @ [phone] 05/22/14  - BGB  Result(s) reported on 22 May 2014 at 08:35AM.  11:30   Result Comment TROPONIN - RESULTS VERIFIED BY REPEAT TESTING.  - HP TO SUSAN PRESTO $[email or web address]'@[email or web address]'[email or web address]$ , 05/22/14  - READ-BACK PROCESS PERFORMED.  Result(s) reported on 22 May 2014 at 12:30PM.    16:53   Result Comment TROPONIN - RESULTS VERIFIED BY REPEAT TESTING.  - PREVIOUSLY CALLED AT 1230 05/22/2014  - TFK  Result(s) reported on 22 May 2014 at 05:41PM.  18-Jul-15 03:55   Glucose, Serum  106  BUN 13  Creatinine (comp) 0.76  Sodium, Serum 141  Potassium, Serum  3.2  Chloride, Serum  109  CO2, Serum 23  Calcium (Total), Serum 8.8  Anion Gap 9  Osmolality (calc) 282  eGFR (African American) >60  eGFR (Non-African American) >60 (eGFR values <35mL/min/1.73 m2 may be an indication of chronic kidney disease (CKD). Calculated eGFR is useful in patients with stable renal function. The eGFR calculation will not be reliable in acutely ill patients when serum creatinine is changing rapidly. It is not useful in  patients on dialysis. The eGFR calculation may not be applicable to patients at the low and high extremes of body sizes, pregnant women, and vegetarians.)  Magnesium, Serum  1.6 (1.8-2.4 THERAPEUTIC RANGE: 4-7 mg/dL TOXIC: > 10 mg/dL  -----------------------)  Cardiac:  17-Jul-15 07:37   CPK-MB, Serum  4.8 (Result(s) reported on 22 May 2014 at 08:10AM.)  Troponin I  0.87 (0.00-0.05 0.05 ng/mL or less: NEGATIVE  Repeat testing in 3-6 hrs  if clinically indicated. >0.05 ng/mL: POTENTIAL  MYOCARDIAL INJURY. Repeat  testing in 3-6 hrs if  clinically indicated. NOTE: An increase or decrease  of 30% or more on serial  testing suggests a  clinically important change)    11:30   CPK-MB, Serum  37.1 (Result(s) reported on 22 May 2014 at 12:05PM.)  Troponin I  10.00 (0.00-0.05 0.05 ng/mL or less: NEGATIVE  Repeat testing in 3-6 hrs  if  clinically indicated. >0.05 ng/mL: POTENTIAL  MYOCARDIAL INJURY. Repeat  testing in 3-6 hrs if  clinically indicated. NOTE: An increase or decrease  of 30% or more on serial  testing suggests a  clinically important change)    16:53   CPK-MB, Serum  53.2 (Result(s) reported on 22 May 2014 at 05:23PM.)  Troponin I  27.00 (0.00-0.05 0.05 ng/mL or less: NEGATIVE  Repeat testing in 3-6 hrs  if clinically indicated. >0.05 ng/mL: POTENTIAL  MYOCARDIAL INJURY. Repeat  testing in 3-6 hrs if  clinically indicated. NOTE: An increase or decrease  of 30% or more on serial  testing suggests a  clinically important change)    23:38   CPK-MB, Serum  43.4 (Result(s) reported on 23 May 2014 at 12:51AM.)  Routine Hem:  18-Jul-15 03:55   WBC (CBC) 11.0  RBC (CBC)  5.21  Hemoglobin (CBC) 15.1  Hematocrit (CBC) 46.9  Platelet Count (CBC) 167  MCV 90  MCH 29.0  MCHC 32.2  RDW  14.7  Neutrophil % 88.7  Lymphocyte % 3.2  Monocyte % 5.5  Eosinophil % 2.2  Basophil % 0.4  Neutrophil #  9.8  Lymphocyte #  0.4  Monocyte # 0.6  Eosinophil # 0.2  Basophil # 0.0 (Result(s) reported on 23 May 2014 at 05:05AM.)   PERTINENT RADIOLOGY STUDIES: XRay:    16-Jul-15 22:04, Tibia And Fibula Left  Tibia And Fibula Left   REASON FOR EXAM:    fall, abrasion tenderness midshaft tibia  COMMENTS:       PROCEDURE: DXR - DXR TIBIA AND FIBULA LT (LOWER L  -  May 21 2014 10:04PM     CLINICAL DATA:  Pain post trauma    EXAM:  LEFT TIBIA AND FIBULA - 2 VIEW    COMPARISON:  None.    FINDINGS:  Frontal and lateral views were obtained. There is evidence of total  knee arthroplasty with the prosthetic components appearing  well-seated. No acute fracture or dislocation. Bones appear  osteoporotic. No abnormal periosteal reaction.    IMPRESSION:  Total knee replacement prosthetic components appear well seated. No  fracture or dislocation. Bones are somewhat osteoporotic.      Electronically Signed     By: Lowella Grip M.D.    On: 05/21/2014 22:10         Verified By: Leafy Kindle. WOODRUFF, M.D.,  [place]:  PACS Image     16-Jul-15 22:33, CT Cervical Spine Without Contrast  PACS Image     16-Jul-15 22:33, CT Head Without Contrast  PACS Image     17-Jul-15 12:50, CT Chest With Contrast  PACS Image     17-Jul-15 14:22, CT Abdomen and Pelvis Without Contrast  PACS Image   CT:    16-Jul-15 22:33, CT Cervical Spine Without Contrast  CT Cervical Spine Without Contrast   REASON FOR EXAM:    fall, occip. head impact  COMMENTS:       PROCEDURE: CT  - CT CERVICAL SPINE WO  - May 21 2014 10:33PM     CLINICAL DATA:  Fall with the occipital impact and cervical spine  tenderness    EXAM:  CT HEAD WITHOUT CONTRAST    CT CERVICAL SPINE WITHOUT CONTRAST    TECHNIQUE:  Multidetector CT imaging of the head and cervical spine was  performed following the standard protocol without intravenous  contrast. Multiplanar CT image reconstructions of the cervical spine  were also generated.    COMPARISON:  11/24/2012    FINDINGS:  CT HEAD FINDINGS    Skull and Sinuses:Negative for fracture or destructive process. The  mastoids, middle ears, and imaged paranasal sinuses are clear.    Orbits: No acute abnormality.    Brain: No evidence of acute abnormality, such as acute infarction,  hemorrhage, hydrocephalus, or mass lesion/mass effect. Generalized,  senescent cerebral volume loss with ex vacuo ventricular  enlargement. There is moderate to advanced chronic small vessel  disease with ischemic gliosis confluent in the periventricular and  deep cerebral white matter. The pattern is stable from 2014.    CT CERVICAL SPINE FINDINGS    No acute fracture or new/traumatic subluxation. C4-5 anterolisthesis  noted previously is reduced, which accounts for mild facet widening  on the right.    Status post C4-5 anterior cervical discectomy with ventral plate and  screw fixation. There is  complete interbody and posterior element  fusion. Degenerative disc disease is advanced below the fusion, but  there is no evidence of osseous canal stenosis. Facet osteoarthritis  is most severe above the fusion. There is notable cystic change  within the dens, considered degenerative. There is associated  ligamentous thickening ossification -findings which can be seen  with calcium pyrophosphate deposition disease.    No prevertebral edema or gross cervical canal hematoma.    Incidental torus palatini.  Rudimentary cervical ribs.     IMPRESSION:  1. No evidence of acute intracranial or cervical spine fracture.  2. Senescent and degenerative changes are stable from prior and  noted above.  Electronically Signed    By: Jorje Guild M.D.    On: 05/21/2014 22:51  Verified By: Gilford Silvius, M.D.,    16-Jul-15 22:33, CT Head Without Contrast  CT Head Without Contrast   REASON FOR EXAM:    fall, occip. head impact, C4 tender  COMMENTS:       PROCEDURE: CT  - CT HEAD WITHOUT CONTRAST  - May 21 2014 10:33PM     CLINICAL DATA:  Fall with the occipital impact and cervical spine  tenderness    EXAM:  CT HEAD WITHOUT CONTRAST    CT CERVICAL SPINE WITHOUT CONTRAST    TECHNIQUE:  Multidetector CT imaging of the head and cervical spine was  performed following the standard protocol without intravenous  contrast. Multiplanar CT image reconstructions of the cervical spine  were also generated.    COMPARISON:  11/24/2012    FINDINGS:  CT HEAD FINDINGS    Skull and Sinuses:Negative for fracture or destructive process. The  mastoids, middle ears, and imaged paranasal sinuses are clear.    Orbits: No acute abnormality.    Brain: No evidence of acute abnormality, such as acute infarction,  hemorrhage, hydrocephalus, or mass lesion/mass effect. Generalized,  senescent cerebral volume loss with ex vacuo ventricular  enlargement. There is moderate to advanced chronic  smallvessel  disease with ischemic gliosis confluent in the periventricular and  deep cerebral white matter. The pattern is stable from 2014.    CT CERVICAL SPINE FINDINGS    No acute fracture or new/traumatic subluxation. C4-5 anterolisthesis  noted previously is reduced, which accounts for mild facet widening  on the right.    Status post C4-5 anterior cervical discectomy with ventral plate and  screw fixation. There is complete interbody and posterior element  fusion. Degenerative disc disease is advanced below the fusion, but  there is no evidence of osseous canal stenosis. Facet osteoarthritis  is most severe above the fusion. There is notable cystic change  within the dens, considered degenerative. There is associated  ligamentous thickeningossification - findings which can be seen  with calcium pyrophosphate deposition disease.    No prevertebral edema or gross cervical canal hematoma.    Incidental torus palatini.  Rudimentary cervical ribs.     IMPRESSION:  1. No evidence of acute intracranial or cervical spine fracture.  2. Senescent and degenerative changes are stable from prior and  noted above.  Electronically Signed    By: Jorje Guild M.D.    On: 05/21/2014 22:51         Verified By: Gilford Silvius, M.D.,    17-Jul-15 12:50, CT Chest With Contrast  CT Chest With Contrast   REASON FOR EXAM:    (1) abd pain; (2) abd pain  COMMENTS:       PROCEDURE: CT  - CT CHEST WITH CONTRAST  - May 22 2014 12:50PM     CLINICAL DATA:  Abdominal and pelvic pain, low-grade fever. Prior  cholecystectomy and hysterectomy.    EXAM:  CT CHEST, ABDOMEN AND PELVIS WITHOUT CONTRAST    TECHNIQUE:  Multidetector CT imaging of the chest, abdomen and pelvis was  performed following the standard protocol without IV contrast.  COMPARISON:  CT abdomen/ pelvis 12/24/2013    FINDINGS:  CT CHEST FINDINGS    Moderate to severe atheromatous aortic calcification noted  without  aneurysm. Coronary arterial atheromatous calcification also  identified. Cardiomegaly is identified. Great vessels are normal in  caliber. No lymphadenopathy. Evidence of CABG.Trace dependent  pleural effusions are present with associated compressive  atelectasis. Cervical fusion hardware partly visualized. Lungs are  suboptimally visualized due to motion artifact at multiple levels  throughout the exam. Central bronchial wall thickening is identified  with linear subpleural reticular opacities possibly representing  atelectasis. More focal patchy bibasilar opacities, for example  image 41, could represent superimposed pneumonia.    CT ABDOMEN AND PELVIS FINDINGS    Cholecystectomy clips are noted. Unenhanced liver, kidneys, adrenal  glands, spleen, pancreas are unremarkable. Motion degradation at  multiple levels is present throughout this exam. No ascites or  lymphadenopathy or free air.    Uterus and ovaries presumed surgically absent. Small inguinal nodes  are identified. No bowel wall thickening or focal segmental  dilatation. Normal appendix, image 55. S shaped curvature of the  thoracolumbar spine noted.    Severe disc degenerative change noted in the spine and hips.   IMPRESSION:  Patchy dependent bibasilar airspace opacities which could indicate  atelectasis although developing pneumonia could appear similar.    Cardiomegaly.    Allowing for extensive motion artifact, No acute intra-abdominal or  pelvic pathology.      Electronically Signed    By: Conchita Paris M.D.    On: 05/22/2014 14:54       Verified By: Arline Asp, M.D.,    17-Jul-15 14:22, CT Abdomen and Pelvis Without Contrast  CT Abdomen and Pelvis Without Contrast   REASON FOR EXAM:    ABD PAIN  COMMENTS:       PROCEDURE: CT  - CT ABDOMEN AND PELVIS W0  - May 22 2014  2:22PM     CLINICAL DATA:  Abdominal and pelvic pain, low-grade fever. Prior  cholecystectomy and  hysterectomy.    EXAM:  CT CHEST, ABDOMEN AND PELVIS WITHOUT CONTRAST    TECHNIQUE:  Multidetector CT imaging of the chest, abdomen and pelvis was  performed following the standard protocol without IV contrast.  COMPARISON:  CT abdomen/ pelvis 12/24/2013    FINDINGS:  CT CHEST FINDINGS    Moderate to severe atheromatous aortic calcification noted without  aneurysm. Coronary arterial atheromatous calcification also  identified. Cardiomegaly is identified. Great vessels are normal in  caliber. No lymphadenopathy. Evidence of CABG. Trace dependent  pleural effusions are present with associated compressive  atelectasis. Cervical fusion hardware partly visualized. Lungs are  suboptimally visualized due to motion artifact at multiple levels  throughout the exam. Central bronchial wall thickening is identified  with linear subpleural reticular opacities possibly representing  atelectasis. More focal patchy bibasilar opacities, for example  image 41, could represent superimposed pneumonia.    CT ABDOMEN AND PELVIS FINDINGS    Cholecystectomy clips are noted. Unenhanced liver, kidneys, adrenal  glands, spleen, pancreas are unremarkable. Motion degradation at  multiple levels is present throughout this exam. No ascites or  lymphadenopathy or free air.    Uterus and ovaries presumed surgicallyabsent. Small inguinal nodes  are identified. No bowel wall thickening or focal segmental  dilatation. Normal appendix, image 55. S shaped curvature of the  thoracolumbar spine noted.    Severe disc degenerative change noted in the spine and hips.   IMPRESSION:  Patchy dependent bibasilar airspace opacities which could indicate  atelectasis although developing pneumonia could appear similar.    Cardiomegaly.    Allowing for extensive motion artifact, No acute intra-abdominal or  pelvic pathology.      Electronically Signed    By: Conchita Paris M.D.    On: 05/22/2014 14:54        Verified By: Arline Asp, M.D.,   Pertinent Past History:  Pertinent Past History PAST MEDICAL HISTORY: Chronic history of polio, hypertension, coronary artery disease status post CABG, hyperlipidemia, history of severe aortic stenosis, degenerative joint disease with bilateral knee replacement, urinary incontinence, and SVT status post ablation.   PAST SURGICAL HISTORY: Hysterectomy, gallbladder surgery, bunion surgery, carpal tunnel release on both sides, CABG, cervical disk fusion, tonsillectomy.   Hospital Course:  Hospital Course 29 f with Severe left ear, CAD, Dementia  * NSTEMI Not a candidate for intervention due to Severe aortic stenosis, Advanced age. Seen by cardiology. medical management Echo- Severe aortic stenosis. EF 50% ASA, Statin, BB Nitor PRN  * Sepsis with Proteus UTI On abx. Afebrile.  * Severe Aortic stenosis Not a surgical candidate or TAVR  * Dementia  Discussed with family.   Time spent on d/c 40 minutes   Condition on Discharge Guarded   Code Status:  Code Status No Code/Do Not Resuscitate   PHYSICAL EXAM ON DISCHARGE:  Physical Exam:  GEN no acute distress, thin, Pleasantly confused   HEENT moist oral mucosa   NECK supple  No masses   RESP normal resp effort  clear BS   CARD regular rate   ABD denies tenderness  soft  normal BS   EXTR negative cyanosis/clubbing, negative edema   SKIN No rashes   PSYCH alert, Dementia   VITAL SIGNS:  Vital Signs: **Vital Signs.:   21-Jul-15 06:00  Vital Signs Type Routine  Temperature Temperature (F) 96.8  Celsius 36  Temperature Source axillary  Pulse Pulse 74  Respirations Respirations 18  Systolic BP Systolic BP 656  Diastolic BP (mmHg) Diastolic BP (mmHg) 84  Mean BP 105  Pulse Ox % Pulse Ox % 92  Pulse Ox Activity Level  At rest  Oxygen Delivery Room Air/ 21 %    09:03  Temperature Temperature (F) 97.6  Celsius 36.4  Pulse Pulse 76  Respirations Respirations  16  Systolic BP Systolic BP 812  Diastolic BP (mmHg) Diastolic BP (mmHg) 83  Mean BP 103  Pulse Ox % Pulse Ox % 94  Pulse Ox Activity Level  At rest  Oxygen Delivery Room Air/ 21 %   DISCHARGE INSTRUCTIONS HOME MEDS:  Medication Reconciliation: Patient's Home Medications at Discharge:     Medication Instructions  metoprolol succinate er 100 mg oral tablet, extended release  1 tab(s) orally once a day   aspirin 81 mg oral tablet  1 tab(s) orally once a day   toviaz 8 mg oral tablet, extended release  1 tab(s) orally once a day   celebrex 200 mg oral capsule  1 cap(s) orally once a day   citalopram 20 mg oral tablet  1 tab(s) orally once a day   colace sodium 100 mg oral capsule  1 cap(s) orally 2 times a day   exelon 9.5 mg/24 hr transdermal film, extended release  1  transdermal once a day   atorvastatin 20 mg oral tablet  1 tab(s) orally once a day (at bedtime)   lorazepam 0.5 mg oral tablet  1 tab(s) orally 2 times a day, As Needed - for Agitation   ranitidine 150 mg oral tablet  1 tab(s) orally every 12 hours   losartan 50 mg oral tablet  1 tab(s) orally once a day   nitroglycerin 0.4 mg sublingual tablet  1 tab(s) sublingual prn, As Needed - for Chest Pain   cipro 250 mg oral tablet  1 tab(s) orally 2 times a day - 3 days  Physician's Instructions:  Diet Low Sodium   Activity Limitations As tolerated  with assist   Return to Work Not Applicable   Time frame for Follow Up Appointment 1-2 days  Physician at rehab   Time frame for Follow Up Appointment 1-2 weeks  Dr. Rockey Situ   Electronic Signatures: Darvin Neighbours, Lottie Dawson (MD)  (Signed 21-Jul-15 11:40)  Authored: ADMISSION DATE AND DIAGNOSIS, CHIEF COMPLAINT/HPI, Allergies, PERTINENT LABS, PERTINENT RADIOLOGY STUDIES, PERTINENT PAST HISTORY, HOSPITAL COURSE, PHYSICAL EXAM ON [place], PATIENT INSTRUCTIONS   Last Updated: 21-Jul-15 11:40 by Alba Destine (MD)

## 2015-02-27 NOTE — H&P (Signed)
PATIENT NAME:  [name], [name] MR#:  [id number] DATE OF BIRTH:  10-07-1931  DATE OF ADMISSION:  05/22/2014  PRIMARY CARE PHYSICIAN: Dr. Larwance Sachs  REFERRING PHYSICIAN: Dr. Manson Passey  CHIEF COMPLAINT: Generalized weakness, lower abdominal pain and low-grade fever.   HISTORY OF PRESENT ILLNESS: The patient is an 81 year old Caucasian female who was seen and evaluated in the ED yesterday after she sustained a fall. She was diagnosed with urinary tract infection. She was given 1 dose of IV Rocephin and she was discharged home with p.o. Bactrim. The patient woke up this morning and needed to use the restroom and was complaining of lower abdominal pain to her husband. She was extremely weak and unable to get out of the bed without any assistance. As the patient was having difficulty in getting out of the bed and complaining of abdominal pain husband has called EMS and she was sent over to the ED. According to the ER staff, the patient answered a few questions, opens her eyes and smiles, states no abdominal pain, but very uncomfortable with abdominal palpation. I was unable to get any history from the patient. The patient just nods her head, but not talking. No family members at bedside. CT of the head and cervical spine were ordered, which revealed no acute findings. CT of abdomen is ordered, which is pending at this time. The patient was tachycardic and tachypneic, and has a temp of 99.1. IV Rocephin was given in the ED last night.   PAST MEDICAL HISTORY: Chronic history of polio, hypertension, coronary artery disease status post CABG, hyperlipidemia, history of severe aortic stenosis, degenerative joint disease with bilateral knee replacement, urinary incontinence, and SVT status post ablation.   PAST SURGICAL HISTORY: Hysterectomy, gallbladder surgery, bunion surgery, carpal tunnel release on both sides, CABG, cervical disk fusion, tonsillectomy.   ALLERGIES: IMDUR AND PENICILLIN.   PSYCHOSOCIAL HISTORY: Lives  at home with husband. According to old records, no smoking, alcohol or illicit drug use.   FAMILY HISTORY: Coronary artery disease runs in her family.  REVIEW OF SYSTEMS: Unobtainable as the patient has advanced dementia and seemed to be confused and lethargic.   HOME MEDICATIONS: Toviaz 8 mg p.o. once daily, metoprolol succinate 5 mg p.o. once daily, losartan 50 mg p.o. once daily, lorazepam 0.5 mg 1 tablet p.o. 2 times a day, Exelon 1  transdermal patch once daily, Colace 100 mg p.o. b.i.d., citalopram 20 mg p.o. once daily, Celebrex 200 mg 1 capsule p.o. once daily, Bactrim DS 1 tablet p.o. 2 times a day (it was prescribed and given yesterday), atorvastatin 20 mg p.o. once daily, aspirin 81 mg p.o. once daily, amlodipine 5 mg p.o. once daily.  REVIEW OF SYSTEMS: Unobtainable.   PHYSICAL EXAMINATION: VITAL SIGNS: Temperature 99.1, pulse 112, respirations 22, blood pressure 129/98, pulse ox 97%.  GENERAL APPEARANCE: The patient is uncomfortable.  HEENT: Normocephalic, atraumatic. Pupils are equally reacting to light and accommodation. No scleral icterus. No conjunctival injection. No sinus tenderness. No postnasal drip. Moist mucous membranes.  NECK: Supple. No JVD. No thyromegaly. Spontaneously moving her neck in all directions.  LUNGS: Clear to auscultation bilaterally. No accessory muscle usage. No anterior chest wall tenderness on palpation.  CARDIAC: S1 and S2 normal. Regular rate and rhythm. No murmurs.  ABDOMEN: Soft. Bowel sounds are positive in all 4 quadrants. Generalized tenderness is present in the lower abdominal area, but no rebound tenderness. No masses felt.  NEUROLOGIC: Awake and alert, but the patient is demented, unable to get any  history from her. Reflexes are 2+, spontaneously moving her extremities. Motor seems to be intact.  EXTREMITIES: No edema. No cyanosis. No clubbing.  SKIN: Warm to touch. Normal turgor. No rashes. No lesions.  MUSCULOSKELETAL: No joint effusion. No  tenderness.  DIAGNOSTIC DATA: BMP shows glucose at 113 and potassium at 3.4. The rest of the BMP is normal. LFTs are normal. Troponin less than 0.02. CBC: WBC is normal, hemoglobin at 15.5, hematocrit 48, and normal platelet count. MCV normal. Urinalysis: Yellow in color, cloudy in appearance, nitrites are positive, leukocyte esterase 3+, bacteria 3+.  CT of abdomen and pelvis is ordered, which is pending.   CAT scan of the head and cervical spine without contrast: No evidence of acute intracranial or cervical spine fracture. The degenerative changes are stable from prior and noted above.   EKG has revealed sinus tachycardia at 112, left ventricular hypertrophy, and subtle ST depressions from leads V4 through V6.   ASSESSMENT AND PLAN: An 81 year old Caucasian female who was evaluated in the ED yesterday evening, was diagnosed with a urinary tract infection. She was given IV Rocephin and was discharged home with p.o. Bactrim. This morning the patient was unable to get out of the bed and was complaining of abdominal pain regarding which husband has called EMS and brought her into the ED. In the ED, we were unable to get any history from the patient, and the patient was found to be tachycardic and tachypneic. She has low grade fever at 99.1. CAT scan of the abdomen is ordered, which is pending at this time.  1.  Sepsis with acute cystitis with low grade fever, tachycardia and tachypnea. Pan cultures were obtained. The patient was given IV Rocephin last night. We will continue the same antibiotics.  2.  Acute lower abdominal pain. CAT scan of the abdomen and pelvis is ordered with contrast, which is pending.  3.  Abnormal EKG with subtle ST depressions, but the EKG has a lot of artifact. First troponin is negative. Will cycle cardiac biomarkers and cardiology consult is placed as the patient has history of coronary artery disease and other risk factors.  4.  Chronic history of polio and hypertension.  Resume her home medications and will up titrate medications as needed basis.  5.  Chronic history of dementia.   CODE STATUS: She is FULL code until code status is determined. Husband reported that he will be coming to the ED as soon as possible.   TOTAL TIME SPENT ON ADMISSION: 50 minutes.  ____________________________ Ramonita Lab, MD ag:sb D: 05/22/2014 07:41:15 ET T: 05/22/2014 08:06:53 ET JOB#: [id number]  cc: Ramonita Lab, MD, <Dictator> Kandyce Rud, MD Ramonita Lab MD ELECTRONICALLY SIGNED 05/25/2014 0:44

## 2015-02-27 NOTE — Consult Note (Signed)
General Aspect [name] is a 81yo Caucasian female w/ PMHx s/f CAD s/p CABG in 1995, SVT s/p RFA, valvular heart disease (see below), HTN, HLD, urinary incontinence and DJD who was admitted to [facility] yesterday for abdominal pain, fecal impaction and elevated troponin.   She does not follow w/ a cardiologist regularly. Last seen by Dr. Nehemiah Massed in 2001. H/o CABG in 1995. AVNRT s/p RFA at [facility]. She has been feeling well otherwise.   2D echo 11/2012: preserved EF, severe AS, moderate AI/TR, mild MR  She did have some intermittent chest discomfort at rest this past Monday per her husband. Otherwise, no exertional chest discomfort or dyspnea. She developed severe, 10/10 LLQ abdominal pain the day of admission. She called her PCP's office and was advised to present to the ED.   Present Illness . There, she was in severe pain. EKG revealed NSR, subtle ST depressions V5, V6, I, aVL. HR as high as 100, BP 194/89. Abdominal CT showed fecal impaction. She received oral contrast for the study and had two large BMs w/ marked improvement and eventual resolution of her symptoms. Troponins were trended and returned mildly elevated with an up-trend: 0.37->0.46->0.56. Lactic acid 1.9. Given these findings, she was observed overnight.   U/a did show 3+ LE.   There were no overnight events. She is asymptomatic this AM.   She underwent 2D echo this morning. Preliminary findings reveal a preserved EF, severe aortic valve stenosis.   PAST MEDICAL AND SURGICAL HISTORY 1. Known coronary artery disease, status post coronary artery bypass graft in 1995. 2. Hypertension. 3. Hyperlipidemia. 4. Degenerative joint disease in her left knee, status post left knee replacement in 1997. 5. History of polio. 6. History of urinary incontinence. 7. Remote hysterectomy, bunionectomy, and ganglion cyst excision. 8. Valvular heart disease- severe AS, moderate AI/TR  FAMILY HISTORY The patient's father and brother died of  myocardial infarction at an early age.  Her mother had a myocardial infarction at an early age.  There is no other family history of breast or colon cancer.  SOCIAL HISTORY The patient denies alcohol or tobacco use at this time.  ALLERGIES She has allergies to penicillin and Isordil.   Physical Exam:  GEN well developed, well nourished, no acute distress, thin   HEENT PERRL, hearing intact to voice   NECK supple  No masses  trachea midline   RESP normal resp effort  clear BS  no use of accessory muscles   CARD Regular rate and rhythm  Normal, S1, S2  III-IV/VI systolic crescendo-decrescendo murmur in all regions, most prominent in RUSB   ABD denies tenderness  soft  normal BS   EXTR negative cyanosis/clubbing, negative edema   SKIN normal to palpation   NEURO follows commands, motor/sensory function intact   PSYCH alert, A+O to time, place, person   Review of Systems:  Subjective/Chief Complaint abdominal pain, poor historian   General: No Complaints    Skin: No Complaints    ENT: No Complaints    Eyes: No Complaints    Neck: No Complaints    Respiratory: No Complaints    Cardiovascular: Chest pain or discomfort   Gastrointestinal: Constipation  abdominal pain   Genitourinary: No Complaints    Vascular: No Complaints    Musculoskeletal: No Complaints    Neurologic: No Complaints    Hematologic: No Complaints    Endocrine: No Complaints    Psychiatric: No Complaints    Review of Systems: All other systems were reviewed  and found to be negative   ROS she is a poor historian (dementia?)    Medications/Allergies Reviewed Medications/Allergies reviewed           Admit Diagnosis:   ELEVATED TROPONIN: Onset Date: 25-Dec-2013, Status: Active, Description: ELEVATED TROPONIN  Home Medications: Medication Instructions Status  Metoprolol Succinate ER 100 mg oral tablet, extended release 1 tab(s) orally once a day Active  aspirin 81 mg oral tablet 1  tab(s) orally once a day Active  Toviaz 8 mg oral tablet, extended release 1 tab(s) orally once a day Active  Citracal Calcium + D Slow Release 1200 600 mg-500 intl units oral tablet, extended release 1 tab(s) orally once a day Active  Celebrex 200 mg oral capsule 1 cap(s) orally once a day Active  citalopram 20 mg oral tablet 1 tab(s) orally once a day Active  Exelon 9.5 mg/24 hr transdermal film, extended release 1 patch transdermal once a day Active  losartan 50 mg oral tablet 1 tab(s) orally once a day Active  amLODIPine 5 mg oral tablet 1 tab(s) orally once a day Active  Centrum Silver Therapeutic Multiple Vitamins with Minerals oral tablet 1 tab(s) orally once a day Active   Lab Results:  Lab:  18-Feb-15 18:00   Lactic Acid, Cardiopulmonary  1.9 (Result(s) reported on 24 Dec 2013 at 06:12PM.)  Routine Chem:  19-Feb-15 01:53   Result Comment TROPONIN - RESULTS VERIFIED BY REPEAT TESTING.  - PREV CALLED @ 1837 12-24-13 BY KLS.  - 12-25-13 SJL 0352  Result(s) reported on 25 Dec 2013 at 03:52AM.  Glucose, Serum  112  BUN 14  Creatinine (comp) 0.77  Sodium, Serum 139  Potassium, Serum  3.0  Chloride, Serum 106  CO2, Serum 25  Calcium (Total), Serum 9.4  Anion Gap 8  Osmolality (calc) 279  eGFR (African American) >60  eGFR (Non-African American) >60 (eGFR values <29mL/min/1.73 m2 may be an indication of chronic kidney disease (CKD). Calculated eGFR is useful in patients with stable renal function. The eGFR calculation will not be reliable in acutely ill patients when serum creatinine is changing rapidly. It is not useful in  patients on dialysis. The eGFR calculation may not be applicable to patients at the low and high extremes of body sizes, pregnant women, and vegetarians.)  Cardiac:  18-Feb-15 17:36   Troponin I  0.37 (0.00-0.05 0.05 ng/mL or less: NEGATIVE  Repeat testing in 3-6 hrs  if clinically indicated. >0.05 ng/mL: POTENTIAL  MYOCARDIAL INJURY. Repeat   testing in 3-6 hrs if  clinically indicated. NOTE: An increase or decrease  of 30% or more on serial  testing suggests a  clinically important change)  CPK-MB, Serum 2.2 (Result(s) reported on 24 Dec 2013 at 10:24PM.)    22:23   Troponin I  0.46 (0.00-0.05 0.05 ng/mL or less: NEGATIVE  Repeat testing in 3-6 hrs  if clinically indicated. >0.05 ng/mL: POTENTIAL  MYOCARDIAL INJURY. Repeat  testing in 3-6 hrs if  clinically indicated. NOTE: An increase or decrease  of 30% or more on serial  testing suggests a  clinically important change)  CPK-MB, Serum 2.0 (Result(s) reported on 24 Dec 2013 at 11:12PM.)  19-Feb-15 01:53   Troponin I  0.56 (0.00-0.05 0.05 ng/mL or less: NEGATIVE  Repeat testing in 3-6 hrs  if clinically indicated. >0.05 ng/mL: POTENTIAL  MYOCARDIAL INJURY. Repeat  testing in 3-6 hrs if  clinically indicated. NOTE: An increase or decrease  of 30% or more on serial  testing suggests a  clinically important change)  CPK-MB, Serum 2.2 (Result(s) reported on 25 Dec 2013 at 03:41AM.)  Routine Hem:  19-Feb-15 01:53   WBC (CBC) 10.7  RBC (CBC) 4.97  Hemoglobin (CBC) 14.9  Hematocrit (CBC) 43.8  Platelet Count (CBC) 168  MCV 88  MCH 29.9  MCHC 34.0  RDW  14.8  Neutrophil % 80.0  Lymphocyte % 8.7  Monocyte % 9.6  Eosinophil % 0.9  Basophil % 0.8  Neutrophil #  8.5  Lymphocyte #  0.9  Monocyte #  1.0  Eosinophil # 0.1  Basophil # 0.1 (Result(s) reported on 25 Dec 2013 at 02:16AM.)   EKG:  Interpretation EKG shows NSR, flat 1 mm ST depressions V5, V6, I, aVL   Rate 94   Radiology Results: CT:    18-Feb-15 20:20, CT Abdomen and Pelvis With Contrast  CT Abdomen and Pelvis With Contrast   REASON FOR EXAM:    (1) severe intermittent LLQ abd pain, but now RLQ   tenderness; (2) severe intermi  COMMENTS:       PROCEDURE: CT  - CT ABDOMEN / PELVIS  W  - Dec 24 2013  8:20PM     CLINICAL DATA:  Abdominal pain.    EXAM:  CT ABDOMEN AND PELVISWITH  CONTRAST    TECHNIQUE:  Multidetector CT imaging of the abdomen and pelvis was performed  using the standard protocol following bolus administration of  intravenous contrast.  CONTRAST:  100 mL Isovue 370 IV    COMPARISON:  07/01/2013    FINDINGS:  Previous median sternotomy. Bronchiectasis in the visualized lung  bases. Four-chamber cardiac enlargement, incompletely visualized.    Surgical clips in the gallbladder fossa. Mild prominence of central  intrahepatic bile ducts, stable. No focal liver lesion. Unremarkable  spleen, adrenal glands, pancreas, kidneys. Coarse aortoiliac  arterial calcifications without aneurysm or high-grade stenosis.  Stomach and small bowel are nondilated.    Moderate fecal material distends the sigmoid flexureof the colon  and rectum. There is mild mucosal enhancement in the transverse  colon. No pericolonic inflammatory/ edematous changes however. No  definite wall thickening. No pneumatosis. Portal vein patent.  Urinary bladder physiologically distended. Previous hysterectomy. No  ascites. No free air. No adenopathy localized. No hydronephrosis on  delayed scans. Thoracolumbar dextroscoliosis apex L2 with multilevel  degenerative changes. Bilateral hip arthritis with subchondral  cysts.     IMPRESSION:  1. Moderate fecal material distending the splenic flexure and  rectum, with prominent mucosal enhancement in the transverse colon  suggesting possible early stercoral colitis.    Electronically Signed    By: Arne Cleveland M.D.    On: 02/18/201520:33         Verified By: Kandis Cocking, M.D.,    Isosorbide Mononitrate: Rash  Penicillin: Rash  Vital Signs/Nurse's Notes:  **Vital Signs.:   19-Feb-15 04:56  Vital Signs Type Routine  Temperature Temperature (F) 97.8  Celsius 67.3  Systolic BP Systolic BP 419  Diastolic BP (mmHg) Diastolic BP (mmHg) 71  Mean BP 98  Pulse Ox % Pulse Ox % 94  Oxygen Delivery Room Air/ 21 %     Impression 1. Elevated troponin Suspect demand mediated by abdominal pain, constipation w/ straining, hypertension and tachycardia  Underlying native CAD s/p CABG, with bypass grafts that are ~79 years old now.  Previously diagnosed severe AS on 11/2012 echo, once again appreciated on echo this AM.  Has not followed a cardiologist since the early 2000s. No exertional chest pain or dyspnea.  Subtle EKG findings are new from 11/2012. Troponintrend mildly positive w/ a small up-trend. No current chest pain.  -- No further work-up at this time (see below) continue medical management  2. Severe AS Noted on 11/2012 echo and on echo this AM. Concern that this has progressed even more over the past year. She denies associated symptoms with this though she is a poor historian;  we will need to address surgical options as an outpatient. She will likely need follow-up TEE and cardiac catheterization priorn to any surgical intervention -- Outpt  work-up of AS and discussion regarding future surgical options, patient preference for management. Certainly, patient's short-term memory loss and functional capacity (uses walker intermittently) will need to be considered. Along this process, cardiac cath would ultimately need to be performed in the near future. Will defer ischemic evaluation until that time. Patient is currently stable and asymptomatic. -- Follow-up w/ Dr. Rockey Situ 2/26 at 11:15AM  3. CAD s/p CABG in 1995 Denies exertional chest discomfort. Intermittent chest pains on Monday at rest. EKG does show new ST depressions V5, V6, I, aVL compared to prior tracings. Troponins mildly elevated, uptrending.  -- Upgrade to full-dose ASA, continue ARB, BB, NTG SL PRN -- Add high-potency statin (had previously been on Crestor 10). They would like to discuss w/ PCP as an OP.   4. SVT s/p RFA Maintaining NSR, no evidence of arrhythmia on telemetry. -- Continue BB therapy   Plan 5. Hypertension Markedly elevated  yesterday due to pain, straining, etc. Remains elevated this AM however.  -- Increase losartan to $RemoveBef'100mg'[id number]$  daily or amlodipine to $RemoveBefor'10mg'[id number]$  daily  6. UTI 3+ LE on u/a. -- Started on IV abx per primary team  7. Fecal impaction, constipation Resolved after BMs yesterday. Also covered empirically for colitis. -- Management per primary team.   Electronic Signatures: Katrina Brosh A (PA-C)  (Signed 19-Feb-15 12:14)  Authored: General Aspect/Present Illness, History and Physical Exam, Review of System, Home Medications, Labs, EKG , Radiology, Allergies, Vital Signs/Nurse's Notes, Impression/Plan Ida Rogue (MD)  (Signed 19-Feb-15 15:22)  Authored: General Aspect/Present Illness, History and Physical Exam, Review of System, Health Issues, Labs, EKG , Vital Signs/Nurse's Notes, Impression/Plan  Co-Signer: General Aspect/Present Illness, History and Physical Exam, Review of System, Home Medications, Labs, EKG , Radiology, Allergies, Vital Signs/Nurse's Notes, Impression/Plan   Last Updated: 19-Feb-15 15:22 by Ida Rogue (MD)

## 2015-02-27 NOTE — H&P (Signed)
PATIENT NAME:  [name] MR#:  [id number] DATE OF BIRTH:  1931-10-11  DATE OF ADMISSION:  12/24/2013  PRIMARY CARE PHYSICIAN:  Dr. Berenda Morale.   REFERRING PHYSICIAN:  Dr. Loleta Rose.   CHIEF COMPLAINT:  Abdominal pain.   HISTORY OF PRESENT ILLNESS:  [name] is an 81 year old female, has history of hypertension, hyperlipidemia, coronary artery disease, status post CABG presented to the Emergency Department with complaints of abdominal pain for the last two days.  The patient experienced two days back pain, however it improved.  Today, since this morning, started to have severe pain in the left lower quadrant, 10 by 10 intensity.  The patient was screaming secondary to pain.  Concerning this, tried to contact primary care physician who recommended the patient to go to the Emergency Department.  Work-up in the Emergency Department, CT abdomen and pelvis showed the patient has fecal impaction.  After drinking the contrast, the patient had two good bowel movements with significant improvement with pain.  However, the patient was noted to have elevated troponins of 0.37 with mild elevation of the lactic acid of 1.9.  The patient is also found to have urinary tract infection with 3+ leukocyte esterase, WBC of 25, 3+ bacteria.  The patient also had a lot of epithelial cells.  Concerning about the patient's elevated troponin, the decision is made to observe the patient.  The patient denies having any chest pain, palpitations or shortness of breath.   PAST MEDICAL HISTORY: 1.  Coronary artery disease status post CABG and stenting in 1990s.  2.  Hypertension.  3.  Hyperlipidemia.  4.  History of polio.  5.  Degenerative joint disease with bilateral knee replacement.  6.  History of supraventricular tachycardia status post ablation.  7.  Urinary incontinence.   PAST SURGICAL HISTORY: 1.  Hysterectomy.  2.  Gallbladder surgery. 3.  Bunion surgery.  4.  Coronary artery bypass grafting.  5.   Cervical disk fusion. 6.  Carpal tunnel release on both sides. 7.  Tonsillectomy.  ALLERGIES: 1.  IMDUR. 2.  PENICILLIN.   HOME MEDICATIONS:  1.  Toviaz 8 mg once a day.  2.  Toprol-XL 1 tablet once a day.  3.  Losartan 50 mg once a day.  4.  Exelon patch once a day.  5.  Citracal once a day.  6.  Citalopram 20 mg once a day.  7.  Centrum Silver 1 tablet once a day.  8.  Celebrex 200 mg once a day.  9.  Aspirin 81 mg once a day.  10.  Amlodipine 5 mg once a day.   SOCIAL HISTORY:  No history of smoking, drinking alcohol or using illicit drugs.  Married, lives with her husband.   FAMILY HISTORY:  Coronary artery disease in brother.   REVIEW OF SYSTEMS:   The patient seems to have baseline dementia.  The patient looks at her husband for every answer, however:  CONSTITUTIONAL:  Has been experiencing some generalized weakness.  EYES:  No change in vision.  EARS, NOSE, THROAT:  No change in hearing.  RESPIRATORY:  No cough, shortness of breath.  CARDIOVASCULAR:  No chest pain, palpitations.  GASTROINTESTINAL:  Abdominal pain.  Denies having any nausea.  GENITOURINARY:  No dysuria or hematuria.  ENDOCRINE:  No polyuria or polydipsia.  HEMATOLOGIC:  No easy bruising or bleeding.  SKIN:  No rash or lesions.  MUSCULOSKELETAL:  Has osteoarthritis.  Poor functional mobility.  NEUROLOGIC:  No weakness or numbness  in any part of the body.   PHYSICAL EXAMINATION: GENERAL:  This is a well-built, well-nourished, age-appropriate female lying down in the bed, not in distress.  VITAL SIGNS:  Temperature 98.7, pulse 88, blood pressure 170/76, respiratory rate of 20, oxygen saturation is 93% on room air.  HEENT:  Head normocephalic, atraumatic.  Eyes, no scleral icterus.  Conjunctivae normal.  Pupils equal and react to light.  Extraocular movements are intact.  Mucous membranes moist.  No pharyngeal erythema.  NECK:  Supple.  No lymphadenopathy.  No JVD.  No carotid bruit.  CHEST:  Has no  focal tenderness.  Lungs bilateral clear to auscultation.  HEART:  S1, S2, regular, has a systolic murmur radiating to the carotids.  Also has some friction pericardial rub throughout the systole and diastole.  No pedal edema.  Pulses 2+.  ABDOMEN:  Bowel sounds plus.  Soft, nontender, nondistended.  Mild tenderness in the left lower quadrant.  No guarding or rebound tenderness.  Could not appreciate any hepatosplenomegaly.  NEUROLOGIC:  The patient is alert, oriented to place, person and time.  No cranial nerve abnormalities.  Motor 5 by 5 in upper and lower extremities.  SKIN:  No rash or lesions.  MUSCULOSKELETAL:  Good range of motion in all the extremities.   LABORATORY DATA:  UA as mentioned above, 3+ leukocyte esterase, WBC of 25, bacteria of 3+.  CT abdomen and pelvis, moderate fecal material distending the splenic flexure and the rectum with prominent mucosal enhancement in the transverse colon suggesting a possible early  colitis.  Troponin 0.37, lipase 147.  CBC:  WBC of 9.4, hemoglobin 15.8, platelet count of 185.   CMP is completely within normal limits.   ASSESSMENT AND PLAN:  [name] is an 81 year old female who comes to the Emergency Department with abdominal pain.  1.  Fecal impaction associated with  colitis.  The patient had a bowel movement.  Has elevated lactic acid of 1.9.  Continue with IV fluids.  2.  Elevated troponin.  The patient has a normal CK-MB.  We will continue to follow up with the cardiac enzymes.  This could be secondary to stress.  We will obtain echocardiogram, considering the patient's murmur as well as elevated troponins.  3.  Urinary tract infection.  We will repeat the urinalysis and urine cultures.  Keep the patient on Rocephin until then.  4.  Hypertension, accelerated, most likely secondary to stress.  We will continue to follow up.  Continue the home medications.  5.  History of coronary artery disease.  Continue with aspirin, losartan and metoprolol.   6.  Keep the patient on deep vein thrombosis prophylaxis with Lovenox.   TIME SPENT:  50 minutes.   ____________________________ Susa Griffins, MD pv:ea D: 12/24/2013 22:41:43 ET T: 12/24/2013 23:00:55 ET JOB#: [id number]  cc: Susa Griffins, MD, <Dictator> Kandyce Rud, MD Susa Griffins MD ELECTRONICALLY SIGNED 01/08/2014 1:26

## 2015-03-23 ENCOUNTER — Ambulatory Visit (INDEPENDENT_AMBULATORY_CARE_PROVIDER_SITE_OTHER): Payer: Medicare Other | Admitting: Cardiovascular Disease

## 2015-03-23 ENCOUNTER — Encounter: Payer: Self-pay | Admitting: Cardiovascular Disease

## 2015-03-23 VITALS — BP 110/68 | HR 76 | Ht 60.0 in | Wt 113.0 lb

## 2015-03-23 DIAGNOSIS — I2510 Atherosclerotic heart disease of native coronary artery without angina pectoris: Secondary | ICD-10-CM | POA: Diagnosis not present

## 2015-03-23 DIAGNOSIS — I5022 Chronic systolic (congestive) heart failure: Secondary | ICD-10-CM

## 2015-03-23 DIAGNOSIS — I350 Nonrheumatic aortic (valve) stenosis: Secondary | ICD-10-CM

## 2015-03-23 NOTE — Assessment & Plan Note (Signed)
She appears to be euvolemic on small dose furosemide. 

## 2015-03-23 NOTE — Assessment & Plan Note (Signed)
She reports no chest pain.

## 2015-03-23 NOTE — Patient Instructions (Signed)
Medication Instructions:  Your physician recommends that you continue on your current medications as directed. Please refer to the Current Medication list given to you today.   Labwork: None  Testing/Procedures: None  Follow-Up: Your physician wants you to follow-up in: SIX MONTH with Dr. Kirke Corin.  You will receive a reminder letter in the mail two months in advance. If you don't receive a letter, please call our office to schedule the follow-up appointment.   Any Other Special Instructions Will Be Listed Below (If Applicable).

## 2015-03-23 NOTE — Assessment & Plan Note (Signed)
She has critical aortic stenosis and as outlined above. She is not a candidate for any intervention due to advanced dementia. Surprisingly, she  has been stable over the last 6 months. Her mobility is limited and she is mostly wheelchair bound.

## 2015-03-23 NOTE — Progress Notes (Signed)
HPI  This is an 81 year old woman who is here today for a followup visit regarding coronary artery disease, chronic systolic heart failure and critical aortic stenosis.  The patient has a history of coronary artery disease status post remote CABG in the 1990s. She also has a history of supraventricular tachycardia and has undergone radiofrequency ablation at [facility].  However, she was hospitalized in February, 2015 with a urinary tract infection. She was found to have elevated troponin levels and underwent echocardiography demonstrating severe aortic stenosis with LV dysfunction. Cardiac catheterization was performed and this demonstrated severe native coronary artery disease with continued patency of all bypass grafts. She was transferred to [facility] where she underwent evaluation by the heart valve team. She was deemed to be not a good candidate for TAVR due to dementia.  Most recent echo in February of 2015 showed an ejection fraction of 40% with critical aortic stenosis and valve area of 0.25. The patient has severe dementia and cannot provide history. Losartan was discontinued due to hypotension. She has been doing very well since then and denies any chest pain, shortness of breath or lower extremity edema.  Allergies  Allergen Reactions  . Penicillins Other (See Comments)    Unknown  . Isosorbide Rash     Current Outpatient Prescriptions on File Prior to Visit  Medication Sig Dispense Refill  . acetaminophen (TYLENOL) 325 MG tablet Take 650 mg by mouth as needed.    Marland Kitchen acetaminophen (TYLENOL) 650 MG CR tablet Take 650 mg by mouth 3 (three) times daily.    Marland Kitchen aspirin 81 MG tablet Take 81 mg by mouth daily.    Marland Kitchen atorvastatin (LIPITOR) 20 MG tablet Take 20 mg by mouth daily.    . bisacodyl (DULCOLAX) 10 MG suppository Place 10 mg rectally as needed for moderate constipation.    . citalopram (CELEXA) 40 MG tablet Take 40 mg by mouth daily.    . furosemide (LASIX) 20 MG tablet Take  20 mg by mouth 2 (two) times daily.    Marland Kitchen LORazepam (ATIVAN) 0.5 MG tablet Take 0.5 mg by mouth as needed for anxiety.    . metoprolol succinate (TOPROL-XL) 50 MG 24 hr tablet Take 1 tablet (50 mg total) by mouth daily. Take with or immediately following a meal. 90 tablet 3  . nitroGLYCERIN (NITROSTAT) 0.4 MG SL tablet Place 0.4 mg under the tongue every 5 (five) minutes as needed for chest pain.    . NON FORMULARY Med Pass 240 ml bid.    . potassium chloride SA (K-DUR,KLOR-CON) 20 MEQ tablet Take 20 mEq by mouth daily.    . ranitidine (ZANTAC) 150 MG tablet Take 150 mg by mouth as needed for heartburn.     No current facility-administered medications on file prior to visit.     Past Medical History  Diagnosis Date  . Critical aortic valve stenosis     a. Relatively asymptomatic until 12/2013 - not a candidate for TAVR 2/2 dementia.  Marland Kitchen CAD (coronary artery disease)     a. s/p CABG 03/1997 at [facility] -  including LIMA to LAD, LRA to OM, SVG to RCA with open vein harvest left thigh.  . HTN (hypertension)   . Hyperlipidemia   . SVT (supraventricular tachycardia)     a. s/p ablation [facility].  . Polio     childhood  . Urinary incontinence   . Fecal impaction 12/24/2013  . History of UTI 12/24/2013    >100,000 col/mL Hafnia alvei  .  Heart murmur   . History of blood transfusion 1998    "pretty sure she had to have blood when she had bypass surgery  . DJD (degenerative joint disease)   . Osteoarthritis   . Arthritis     "pretty bad"  . Depression     "related to son on combat duty in [place]"   . Moderate dementia     "at times it's pretty bad" (12/31/2013)  . Ischemic cardiomyopathy     a. 12/2013 Echo: EF 20-25%, diff HK, mild AI/Sev AS, mild MR, mod TR.  Marland Kitchen Chronic systolic CHF (congestive heart failure)     a. 12/2013 Echo: EF 20-25%     Past Surgical History  Procedure Laterality Date  . Abdominal hysterectomy    . Bunionectomy    . Ganglion cyst excision    . Cholecystectomy    .  Coronary artery bypass graft  04/02/1997    CABG x3 at [facility] using LIMA to LAD, LRA to OM, SVG to RCA with open vein harvest left thigh  . Total knee arthroplasty Left   . Total knee arthroplasty Right   . Atrial tach ablation      [facility]  . Ankle arthroplasty Right ~ 19138    "polio"  . Tonsillectomy    . Carpal tunnel release Bilateral     "I think she's had both sides" (12/31/2013)  . Cataract extraction w/ intraocular lens  implant, bilateral Bilateral   . Cardiac catheterization      "more than 1"   . Coronary angioplasty    . Cardiac catheterization  12/2013    [facility]     Family History  Problem Relation Age of Onset  . Heart attack Father   . Heart attack Brother   . Heart attack Mother      History   Social History  . Marital Status: Married    Spouse Name: N/A  . Number of Children: N/A  . Years of Education: N/A   Occupational History  . Retired    Social History Main Topics  . Smoking status: Never Smoker   . Smokeless tobacco: Never Used  . Alcohol Use: No  . Drug Use: No  . Sexual Activity: No   Other Topics Concern  . Not on file   Social History Narrative      PHYSICAL EXAM   BP 110/68 mmHg  Pulse 76  Ht 5' (1.524 m)  Wt 113 lb (51.256 kg)  BMI 22.07 kg/m2 Constitutional: She is oriented to person, place, and time. She appears well-developed and well-nourished. No distress.  HENT: No nasal discharge.  Head: Normocephalic and atraumatic.  Eyes: Pupils are equal and round. No discharge.  Neck: Normal range of motion. Neck supple. Mild JVD present. No thyromegaly present.  Cardiovascular: Normal rate, regular rhythm, normal heart sounds. Exam reveals no gallop and no friction rub. There is a 4/6 systolic ejection murmur at the aortic area which is late peaking with absent S2  Pulmonary/Chest: Effort normal and breath sounds normal. No stridor. No respiratory distress. She has no wheezes. She has some bibasilar crackles  Abdominal: Soft. Bowel  sounds are normal. She exhibits no distension. There is no tenderness. There is no rebound and no guarding.  Musculoskeletal: Normal range of motion. She exhibits no edema and no tenderness.  Neurological: She is alert and oriented to person, place, and time. Coordination normal.  Skin: Skin is warm and dry. No rash noted. She is not diaphoretic. No erythema.  No pallor.  Psychiatric: She has a normal mood and affect. Her behavior is normal. Judgment and thought content normal.       ASSESSMENT AND PLAN

## 2015-04-27 DIAGNOSIS — M159 Polyosteoarthritis, unspecified: Secondary | ICD-10-CM | POA: Diagnosis not present

## 2015-04-27 DIAGNOSIS — I350 Nonrheumatic aortic (valve) stenosis: Secondary | ICD-10-CM | POA: Diagnosis not present

## 2015-04-27 DIAGNOSIS — F329 Major depressive disorder, single episode, unspecified: Secondary | ICD-10-CM

## 2015-04-27 DIAGNOSIS — I5022 Chronic systolic (congestive) heart failure: Secondary | ICD-10-CM | POA: Diagnosis not present

## 2015-04-27 DIAGNOSIS — G301 Alzheimer's disease with late onset: Secondary | ICD-10-CM

## 2015-04-27 DIAGNOSIS — F22 Delusional disorders: Secondary | ICD-10-CM

## 2015-06-05 ENCOUNTER — Encounter: Payer: Self-pay | Admitting: Emergency Medicine

## 2015-06-05 ENCOUNTER — Emergency Department
Admission: EM | Admit: 2015-06-05 | Discharge: 2015-06-05 | Disposition: A | Discharge status: Skilled Nursing Facility | Payer: Medicare Other | Attending: Emergency Medicine | Admitting: Emergency Medicine

## 2015-06-05 DIAGNOSIS — Z7982 Long term (current) use of aspirin: Secondary | ICD-10-CM | POA: Diagnosis not present

## 2015-06-05 DIAGNOSIS — R040 Epistaxis: Secondary | ICD-10-CM | POA: Insufficient documentation

## 2015-06-05 DIAGNOSIS — Z79899 Other long term (current) drug therapy: Secondary | ICD-10-CM | POA: Insufficient documentation

## 2015-06-05 DIAGNOSIS — I10 Essential (primary) hypertension: Secondary | ICD-10-CM | POA: Insufficient documentation

## 2015-06-05 LAB — CBC WITH DIFFERENTIAL/PLATELET
BASOS ABS: 0 10*3/uL (ref 0–0.1)
Basophils Relative: 1 %
EOS PCT: 5 %
Eosinophils Absolute: 0.3 10*3/uL (ref 0–0.7)
HCT: 42.9 % (ref 35.0–47.0)
Hemoglobin: 14.1 g/dL (ref 12.0–16.0)
Lymphocytes Relative: 22 %
Lymphs Abs: 1.4 10*3/uL (ref 1.0–3.6)
MCH: 31.2 pg (ref 26.0–34.0)
MCHC: 32.8 g/dL (ref 32.0–36.0)
MCV: 95.1 fL (ref 80.0–100.0)
Monocytes Absolute: 0.6 10*3/uL (ref 0.2–0.9)
Monocytes Relative: 9 %
Neutro Abs: 4.1 10*3/uL (ref 1.4–6.5)
Neutrophils Relative %: 63 %
Platelets: 147 10*3/uL — ABNORMAL LOW (ref 150–440)
RBC: 4.51 MIL/uL (ref 3.80–5.20)
RDW: 14.9 % — AB (ref 11.5–14.5)
WBC: 6.3 10*3/uL (ref 3.6–11.0)

## 2015-06-05 LAB — BASIC METABOLIC PANEL
ANION GAP: 10 (ref 5–15)
BUN: 37 mg/dL — ABNORMAL HIGH (ref 6–20)
CALCIUM: 9.6 mg/dL (ref 8.9–10.3)
CHLORIDE: 100 mmol/L — AB (ref 101–111)
CO2: 31 mmol/L (ref 22–32)
Creatinine, Ser: 0.8 mg/dL (ref 0.44–1.00)
GFR calc Af Amer: >60 mL/min (ref >60)
Glucose, Bld: 110 mg/dL — ABNORMAL HIGH (ref 65–99)
Potassium: 3.7 mmol/L (ref 3.5–5.1)
Sodium: 141 mmol/L (ref 135–145)

## 2015-06-05 LAB — PROTIME-INR
INR: 1.06
Prothrombin Time: 14 seconds (ref 11.4–15.0)

## 2015-06-05 MED ORDER — OXYMETAZOLINE HCL 0.05 % NA SOLN
NASAL | Status: AC
  Filled 2015-06-05: qty 15

## 2015-06-05 NOTE — ED Notes (Signed)
Patient from [facility] with c/o left nare epistaxis. Staff states the bleed as been slow and mostly controlled however it will start bleeding again when she "picks at it", sneezes or coughs. EMS reports minimal blood loss. Hx of delusional dementia

## 2015-06-05 NOTE — Discharge Instructions (Signed)
The bleeding quickly stopped after one spray of Afrin up the left nostril. If bleeding resumes, apply another spray of Afrin. If this is not stop the bleeding, return to the emergency department. At that point, we will likely be required to place packing material into the left nostril. Follow-up with ENT if you continue to have problems. Follow-up with her regular doctor this week.  Nosebleed A nosebleed can be caused by many things, including:  Getting hit hard in the nose.  Infections.  Dry nose.  Colds.  Medicines. Your doctor may do lab testing if you get nosebleeds a lot and the cause is not known. HOME CARE   If your nose was packed with material, keep it there until your doctor takes it out. Put the pack back in your nose if the pack falls out.  Do not blow your nose for 12 hours after the nosebleed.  Sit up and bend forward if your nose starts bleeding again. Pinch the front half of your nose nonstop for 20 minutes.  Put petroleum jelly inside your nose every morning if you have a dry nose.  Use a humidifier to make the air less dry.  Do not take aspirin.  Try not to strain, lift, or bend at the waist for many days after the nosebleed. GET HELP RIGHT AWAY IF:   Nosebleeds keep happening and are hard to stop or control.  You have bleeding or bruises that are not normal on other parts of the body.  You have a fever.  The nosebleeds get worse.  You get lightheaded, feel faint, sweaty, or throw up (vomit) blood. MAKE SURE YOU:   Understand these instructions.  Will watch your condition.  Will get help right away if you are not doing well or get worse. Document Released: 08/01/2008 Document Revised: 01/15/2012 Document Reviewed: 08/01/2008 [facility] Patient Information 2015 [place], [place]. This information is not intended to replace advice given to you by your health care provider. Make sure you discuss any questions you have with your health care provider.

## 2015-06-05 NOTE — ED Provider Notes (Signed)
[facility] Emergency Department Provider Note  ____________________________________________  Time seen:  1830  I have reviewed the triage vital signs and the nursing notes.  History is limited by the patient's dementia. History primarily by report from the nursing home.  HISTORY  Chief Complaint Epistaxis     HPI [name] is a 81 y.o. female who has been sent from [facility] due to bleeding from her left nares. We believe this bleeding began today. It is been intermittent. The bleeding will stop but apparently the patient will then pick at her nose and the bleeding will resume. It also increases with coughing or sneezing.  EMS brought the patient to the emergency department. They reported on the day minimal amount of blood loss.  The patient appears in no acute distress but does have bleeding from her left naris.  History is limited, but we have not heard of any history of prior episodes similar to this.  A review of her medications does not show any anticoagulants. She is on aspirin, 81 mg, once a day.   Past Medical History  Diagnosis Date  . Critical aortic valve stenosis     a. Relatively asymptomatic until 12/2013 - not a candidate for TAVR 2/2 dementia.  Marland Kitchen CAD (coronary artery disease)     a. s/p CABG 03/1997 at [facility] -  including LIMA to LAD, LRA to OM, SVG to RCA with open vein harvest left thigh.  . HTN (hypertension)   . Hyperlipidemia   . SVT (supraventricular tachycardia)     a. s/p ablation [facility].  . Polio     childhood  . Urinary incontinence   . Fecal impaction 12/24/2013  . History of UTI 12/24/2013    >100,000 col/mL Hafnia alvei  . Heart murmur   . History of blood transfusion 1998    "pretty sure she had to have blood when she had bypass surgery  . DJD (degenerative joint disease)   . Osteoarthritis   . Arthritis     "pretty bad"  . Depression     "related to son on combat duty in [place]"   . Moderate dementia     "at times  it's pretty bad" (12/31/2013)  . Ischemic cardiomyopathy     a. 12/2013 Echo: EF 20-25%, diff HK, mild AI/Sev AS, mild MR, mod TR.  Marland Kitchen Chronic systolic CHF (congestive heart failure)     a. 12/2013 Echo: EF 20-25%    Patient Active Problem List   Diagnosis Date Noted  . Chronic systolic heart failure 07/06/2014  . Aortic valve disorders 01/23/2014  . Osteoarthritis   . Aortic stenosis 12/29/2013  . Non-ischemic cardiomyopathy 12/29/2013  . Coronary atherosclerosis of native coronary artery 12/29/2013  . Dementia   . Fecal impaction 12/24/2013  . UTI (urinary tract infection) 12/24/2013  . S/P CABG x 3 04/02/1997    Past Surgical History  Procedure Laterality Date  . Abdominal hysterectomy    . Bunionectomy    . Ganglion cyst excision    . Cholecystectomy    . Coronary artery bypass graft  04/02/1997    CABG x3 at [facility] using LIMA to LAD, LRA to OM, SVG to RCA with open vein harvest left thigh  . Total knee arthroplasty Left   . Total knee arthroplasty Right   . Atrial tach ablation      [facility]  . Ankle arthroplasty Right ~ 19138    "polio"  . Tonsillectomy    . Carpal tunnel  release Bilateral     "I think she's had both sides" (12/31/2013)  . Cataract extraction w/ intraocular lens  implant, bilateral Bilateral   . Cardiac catheterization      "more than 1"   . Coronary angioplasty    . Cardiac catheterization  12/2013    [facility]    Current Outpatient Rx  Name  Route  Sig  Dispense  Refill  . acetaminophen (TYLENOL) 325 MG tablet   Oral   Take 650 mg by mouth as needed.         Marland Kitchen acetaminophen (TYLENOL) 650 MG CR tablet   Oral   Take 650 mg by mouth 3 (three) times daily.         Marland Kitchen aspirin 81 MG tablet   Oral   Take 81 mg by mouth daily.         Marland Kitchen atorvastatin (LIPITOR) 20 MG tablet   Oral   Take 20 mg by mouth daily.         . bisacodyl (DULCOLAX) 10 MG suppository   Rectal   Place 10 mg rectally as needed for moderate constipation.         .  citalopram (CELEXA) 40 MG tablet   Oral   Take 40 mg by mouth daily.         . furosemide (LASIX) 20 MG tablet   Oral   Take 20 mg by mouth 2 (two) times daily.         Marland Kitchen LORazepam (ATIVAN) 0.5 MG tablet   Oral   Take 0.5 mg by mouth as needed for anxiety.         . Magnesium Hydroxide (MILK OF MAGNESIA PO)   Oral   Take by mouth as needed.         . metoprolol succinate (TOPROL-XL) 50 MG 24 hr tablet   Oral   Take 1 tablet (50 mg total) by mouth daily. Take with or immediately following a meal.   90 tablet   3   . morphine (ROXANOL) 20 MG/ML concentrated solution   Oral   Take 20 mg by mouth as needed for severe pain.         . nitroGLYCERIN (NITROSTAT) 0.4 MG SL tablet   Sublingual   Place 0.4 mg under the tongue every 5 (five) minutes as needed for chest pain.         . NON FORMULARY      Med Pass 240 ml bid.         . potassium chloride SA (K-DUR,KLOR-CON) 20 MEQ tablet   Oral   Take 20 mEq by mouth daily.         . QUEtiapine (SEROQUEL) 50 MG tablet   Oral   Take 50 mg by mouth 2 (two) times daily.         . ranitidine (ZANTAC) 150 MG tablet   Oral   Take 150 mg by mouth as needed for heartburn.           Allergies Penicillins and Isosorbide  Family History  Problem Relation Age of Onset  . Heart attack Father   . Heart attack Brother   . Heart attack Mother     Social History History  Substance Use Topics  . Smoking status: Never Smoker   . Smokeless tobacco: Never Used  . Alcohol Use: No    Review of Systems Review of systems not possible due to the patient's dementia.  ENT: Notable for epistaxis.  See history of present illness  ____________________________________________   PHYSICAL EXAM:  VITAL SIGNS: ED Triage Vitals  Enc Vitals Group     BP 06/05/15 1824 121/61 mmHg     Pulse Rate 06/05/15 1824 65     Resp --      Temp 06/05/15 1824 98.2 F (36.8 C)     Temp Source 06/05/15 1824 Oral     SpO2 06/05/15  1824 97 %     Weight 06/05/15 1824 129 lb 8 oz (58.741 kg)     Height --      Head Cir --      Peak Flow --      Pain Score --      Pain Loc --      Pain Edu? --      Excl. in GC? --     Constitutional: Alert, eyes open, appears to pay some attention to the television, responds to voice and to simple questions. Blood from the left nares. No acute distress. ENT   Head: Normocephalic and atraumatic.   Nose: Slow bleeding from the left nares. This includes an occasional drip from the nose. There also dried blood clots around the nares as well..   Mouth/Throat: There is notable blood throughout the oropharynx and on the tongue. While this is not a large amount, it is clear evidence of blood that has come from the nasopharynx.. Cardiovascular: Normal rate, regular rhythm, no murmur noted Respiratory:  Normal respiratory effort, no tachypnea.    Breath sounds are clear and equal bilaterally.  Gastrointestinal: Soft and nontender. No distention.  Musculoskeletal: No deformity noted. Nontender with normal range of motion in all extremities.  No noted edema. Neurologic: Limited speech and language. No gross focal neurologic deficits are appreciated.  Skin:  Skin is warm, dry. No rash noted. Psychiatric: Quiet, calm, responds to simple questions, cooperative..  ____________________________________________    LABS (pertinent positives/negatives)  Labs Reviewed  CBC WITH DIFFERENTIAL/PLATELET - Abnormal; Notable for the following:    RDW 14.9 (*)    Platelets 147 (*)    All other components within normal limits  BASIC METABOLIC PANEL - Abnormal; Notable for the following:    Chloride 100 (*)    Glucose, Bld 110 (*)    BUN 37 (*)    All other components within normal limits  PROTIME-INR      ____________________________________________   PROCEDURES  Treatment for epistaxis: Instillation of Afrin to the left nares. I then placed a simple plastic pincher on her nose. He  remained there for approximately 15 minutes. I remove that at 1900. ____________________________________________   INITIAL IMPRESSION / ASSESSMENT AND PLAN / ED COURSE  Pertinent labs & imaging results that were available during my care of the patient were reviewed by me and considered in my medical decision making (see chart for details).   At this at 19:25, there was no continuing bleeding. The patient looks well and is without complaint. The daughter-in-law is now in the room. We have reviewed Ms. Evora's history.  We will continue to observe her until her lab results come back. If there is no ongoing bleeding will return her to [facility] without nasal packing. ----------------------------------------- 8:18 PM on 06/05/2015 -----------------------------------------  No resumption of bleeding.  ----------------------------------------- 9:05 PM on 06/05/2015 -----------------------------------------  No resumption of bleeding. Patient looks well and comfortable. Blood tests look good, including hemoglobin, platelets, and INR (1.06).  The daughter-in-law has left the emergency department, but we will call either  the daughter-in-law or someone else in the family to help return Mrs. Sharp to [facility].  ----------------------------------------- 9:11 PM on 06/05/2015 -----------------------------------------  The family reports they're unable to transport the patient. This transport does not qualify for payment by Medicare. We will call EMS to facilitate return of the patient based on the family's request. They have been told her we'll be a feeding and they understand this.     ____________________________________________   FINAL CLINICAL IMPRESSION(S) / ED DIAGNOSES  Final diagnoses:  Epistaxis  Dementia, chronic    Darien Ramus, MD 06/05/15 2112

## 2015-06-05 NOTE — ED Notes (Signed)
MD at bedside. 

## 2015-06-07 ENCOUNTER — Telehealth: Payer: Self-pay

## 2015-06-07 NOTE — Telephone Encounter (Signed)
PLEASE NOTE: All timestamps contained within this report are represented as [place] Standard Time. CONFIDENTIALTY NOTICE: This fax transmission is intended only for the addressee. It contains information that is legally privileged, confidential or otherwise protected from use or disclosure. If you are not the intended recipient, you are strictly prohibited from reviewing, disclosing, copying using or disseminating any of this information or taking any action in reliance on or regarding this information. If you have received this fax in error, please notify us immediately by telephone so that we can arrange for its return to [place]. Phone: [phone], Toll-Free: [phone], Fax: [phone] Page: 1 of 1 Call Id: [id number] [facility] Night - Client TELEPHONE ADVICE RECORD [facility] Call Center Patient Name: [facility] Gender: Female DOB: [birth year] Age: 81 Y 9 M 3 D Return Phone Number: Address: City/State/Zip: [place] [address] Client [facility] Night - Client Client Site [facility] [place] - Night Physician Tillman Abide Contact Type Fax Call Type Page Only Caller Name Dondra Spry Relationship To Patient Provider Is this call to report lab results? No Return Phone Number Please choose phone number Initial Comment Mauro Kaufmann with [facility] at [phone]. Resident had 3 moderate nosebleeds since 2pm today. New event for her. Nurse Assessment Guidelines Guideline Title Affirmed Question Affirmed Notes Nurse Date/Time (Eastern Time) Disp. Time Lamount Cohen Time) Disposition Final User 06/05/2015 5:24:46 PM Send to [facility] Paging Queue Rowe Clack 06/05/2015 5:29:18 PM Paged On Call back to Call Center - PC Ulice Dash 06/05/2015 5:32:54 PM Page Completed Yes Ulice Dash After Care Instructions Given Call Event Type User Date / Time Description Paging [facility] Phone DateTime Result/Outcome Message Type  Notes Marylon, Yonke [phone] 06/05/2015 5:29:18 PM Paged On Call Back to Call Center Doctor Paged Please call Morrie Sheldon at [phone]. Adriana, Mckinsey 06/05/2015 5:32:46 PM Paged On Call to Another Provider Message Result connected on-call with facility

## 2015-06-07 NOTE — Telephone Encounter (Signed)
Pt seen [facility] ED on 06/05/15.

## 2015-06-07 NOTE — Telephone Encounter (Signed)
Sent back to [facility] Afrin prn for in case she has recurrent nose bleed

## 2015-06-18 DIAGNOSIS — F323 Major depressive disorder, single episode, severe with psychotic features: Secondary | ICD-10-CM

## 2015-06-18 DIAGNOSIS — G309 Alzheimer's disease, unspecified: Secondary | ICD-10-CM | POA: Diagnosis not present

## 2015-06-18 DIAGNOSIS — I350 Nonrheumatic aortic (valve) stenosis: Secondary | ICD-10-CM

## 2015-06-18 DIAGNOSIS — F22 Delusional disorders: Secondary | ICD-10-CM

## 2015-06-18 DIAGNOSIS — I5020 Unspecified systolic (congestive) heart failure: Secondary | ICD-10-CM

## 2015-06-18 DIAGNOSIS — M1990 Unspecified osteoarthritis, unspecified site: Secondary | ICD-10-CM

## 2015-08-19 DIAGNOSIS — F3340 Major depressive disorder, recurrent, in remission, unspecified: Secondary | ICD-10-CM

## 2015-08-19 DIAGNOSIS — I5022 Chronic systolic (congestive) heart failure: Secondary | ICD-10-CM

## 2015-08-19 DIAGNOSIS — F22 Delusional disorders: Secondary | ICD-10-CM | POA: Diagnosis not present

## 2015-08-19 DIAGNOSIS — G301 Alzheimer's disease with late onset: Secondary | ICD-10-CM | POA: Diagnosis not present

## 2015-08-19 DIAGNOSIS — I350 Nonrheumatic aortic (valve) stenosis: Secondary | ICD-10-CM | POA: Diagnosis not present

## 2015-09-14 ENCOUNTER — Encounter: Payer: Self-pay | Admitting: Medical Oncology

## 2015-09-14 ENCOUNTER — Emergency Department: Payer: Medicare Other

## 2015-09-14 ENCOUNTER — Inpatient Hospital Stay
Admission: EM | Admit: 2015-09-14 | Discharge: 2015-10-07 | DRG: 871 | Disposition: E | Payer: Medicare Other | Attending: Internal Medicine | Admitting: Internal Medicine

## 2015-09-14 DIAGNOSIS — Z8249 Family history of ischemic heart disease and other diseases of the circulatory system: Secondary | ICD-10-CM

## 2015-09-14 DIAGNOSIS — Z87440 Personal history of urinary (tract) infections: Secondary | ICD-10-CM

## 2015-09-14 DIAGNOSIS — Z90710 Acquired absence of both cervix and uterus: Secondary | ICD-10-CM

## 2015-09-14 DIAGNOSIS — Z9889 Other specified postprocedural states: Secondary | ICD-10-CM | POA: Diagnosis not present

## 2015-09-14 DIAGNOSIS — I5022 Chronic systolic (congestive) heart failure: Secondary | ICD-10-CM | POA: Diagnosis present

## 2015-09-14 DIAGNOSIS — Z66 Do not resuscitate: Secondary | ICD-10-CM | POA: Diagnosis present

## 2015-09-14 DIAGNOSIS — R011 Cardiac murmur, unspecified: Secondary | ICD-10-CM | POA: Diagnosis present

## 2015-09-14 DIAGNOSIS — N184 Chronic kidney disease, stage 4 (severe): Secondary | ICD-10-CM | POA: Diagnosis present

## 2015-09-14 DIAGNOSIS — N179 Acute kidney failure, unspecified: Secondary | ICD-10-CM | POA: Diagnosis not present

## 2015-09-14 DIAGNOSIS — B9620 Unspecified Escherichia coli [E. coli] as the cause of diseases classified elsewhere: Secondary | ICD-10-CM | POA: Diagnosis present

## 2015-09-14 DIAGNOSIS — Z880 Allergy status to penicillin: Secondary | ICD-10-CM | POA: Diagnosis not present

## 2015-09-14 DIAGNOSIS — I350 Nonrheumatic aortic (valve) stenosis: Secondary | ICD-10-CM | POA: Diagnosis not present

## 2015-09-14 DIAGNOSIS — Z9861 Coronary angioplasty status: Secondary | ICD-10-CM

## 2015-09-14 DIAGNOSIS — Z9842 Cataract extraction status, left eye: Secondary | ICD-10-CM | POA: Diagnosis not present

## 2015-09-14 DIAGNOSIS — E870 Hyperosmolality and hypernatremia: Secondary | ICD-10-CM | POA: Diagnosis present

## 2015-09-14 DIAGNOSIS — J9601 Acute respiratory failure with hypoxia: Secondary | ICD-10-CM | POA: Diagnosis present

## 2015-09-14 DIAGNOSIS — E872 Acidosis: Secondary | ICD-10-CM | POA: Diagnosis present

## 2015-09-14 DIAGNOSIS — Z96653 Presence of artificial knee joint, bilateral: Secondary | ICD-10-CM | POA: Diagnosis present

## 2015-09-14 DIAGNOSIS — Z9049 Acquired absence of other specified parts of digestive tract: Secondary | ICD-10-CM | POA: Diagnosis not present

## 2015-09-14 DIAGNOSIS — N390 Urinary tract infection, site not specified: Secondary | ICD-10-CM | POA: Diagnosis not present

## 2015-09-14 DIAGNOSIS — R0902 Hypoxemia: Secondary | ICD-10-CM

## 2015-09-14 DIAGNOSIS — J690 Pneumonitis due to inhalation of food and vomit: Secondary | ICD-10-CM | POA: Insufficient documentation

## 2015-09-14 DIAGNOSIS — I2510 Atherosclerotic heart disease of native coronary artery without angina pectoris: Secondary | ICD-10-CM | POA: Diagnosis present

## 2015-09-14 DIAGNOSIS — I248 Other forms of acute ischemic heart disease: Secondary | ICD-10-CM | POA: Diagnosis present

## 2015-09-14 DIAGNOSIS — N189 Chronic kidney disease, unspecified: Secondary | ICD-10-CM | POA: Diagnosis present

## 2015-09-14 DIAGNOSIS — I130 Hypertensive heart and chronic kidney disease with heart failure and stage 1 through stage 4 chronic kidney disease, or unspecified chronic kidney disease: Secondary | ICD-10-CM | POA: Diagnosis present

## 2015-09-14 DIAGNOSIS — I25810 Atherosclerosis of coronary artery bypass graft(s) without angina pectoris: Secondary | ICD-10-CM | POA: Insufficient documentation

## 2015-09-14 DIAGNOSIS — R0682 Tachypnea, not elsewhere classified: Secondary | ICD-10-CM | POA: Diagnosis present

## 2015-09-14 DIAGNOSIS — Z7982 Long term (current) use of aspirin: Secondary | ICD-10-CM

## 2015-09-14 DIAGNOSIS — Z961 Presence of intraocular lens: Secondary | ICD-10-CM | POA: Diagnosis present

## 2015-09-14 DIAGNOSIS — A419 Sepsis, unspecified organism: Principal | ICD-10-CM | POA: Diagnosis present

## 2015-09-14 DIAGNOSIS — Y95 Nosocomial condition: Secondary | ICD-10-CM | POA: Diagnosis present

## 2015-09-14 DIAGNOSIS — R0602 Shortness of breath: Secondary | ICD-10-CM

## 2015-09-14 DIAGNOSIS — M1990 Unspecified osteoarthritis, unspecified site: Secondary | ICD-10-CM | POA: Diagnosis present

## 2015-09-14 DIAGNOSIS — Z888 Allergy status to other drugs, medicaments and biological substances status: Secondary | ICD-10-CM | POA: Diagnosis not present

## 2015-09-14 DIAGNOSIS — Z951 Presence of aortocoronary bypass graft: Secondary | ICD-10-CM

## 2015-09-14 DIAGNOSIS — Z9841 Cataract extraction status, right eye: Secondary | ICD-10-CM | POA: Diagnosis not present

## 2015-09-14 DIAGNOSIS — F0390 Unspecified dementia without behavioral disturbance: Secondary | ICD-10-CM | POA: Diagnosis present

## 2015-09-14 DIAGNOSIS — E875 Hyperkalemia: Secondary | ICD-10-CM | POA: Diagnosis present

## 2015-09-14 DIAGNOSIS — L8990 Pressure ulcer of unspecified site, unspecified stage: Secondary | ICD-10-CM | POA: Insufficient documentation

## 2015-09-14 DIAGNOSIS — E876 Hypokalemia: Secondary | ICD-10-CM | POA: Diagnosis present

## 2015-09-14 DIAGNOSIS — Z79899 Other long term (current) drug therapy: Secondary | ICD-10-CM | POA: Diagnosis not present

## 2015-09-14 DIAGNOSIS — R7989 Other specified abnormal findings of blood chemistry: Secondary | ICD-10-CM | POA: Diagnosis present

## 2015-09-14 DIAGNOSIS — J189 Pneumonia, unspecified organism: Secondary | ICD-10-CM | POA: Diagnosis present

## 2015-09-14 DIAGNOSIS — R778 Other specified abnormalities of plasma proteins: Secondary | ICD-10-CM | POA: Diagnosis present

## 2015-09-14 LAB — CBC WITH DIFFERENTIAL/PLATELET
Basophils Absolute: 0 10*3/uL (ref 0–0.1)
Basophils Relative: 0 %
EOS PCT: 0 %
Eosinophils Absolute: 0 10*3/uL (ref 0–0.7)
HEMATOCRIT: 46.5 % (ref 35.0–47.0)
Hemoglobin: 14.8 g/dL (ref 12.0–16.0)
LYMPHS PCT: 6 %
Lymphs Abs: 0.8 10*3/uL — ABNORMAL LOW (ref 1.0–3.6)
MCH: 30.6 pg (ref 26.0–34.0)
MCHC: 31.9 g/dL — AB (ref 32.0–36.0)
MCV: 95.8 fL (ref 80.0–100.0)
MONO ABS: 0.6 10*3/uL (ref 0.2–0.9)
Monocytes Relative: 4 %
Neutro Abs: 12.9 10*3/uL — ABNORMAL HIGH (ref 1.4–6.5)
Neutrophils Relative %: 90 %
PLATELETS: 217 10*3/uL (ref 150–440)
RBC: 4.86 MIL/uL (ref 3.80–5.20)
RDW: 16.3 % — ABNORMAL HIGH (ref 11.5–14.5)
WBC: 14.3 10*3/uL — ABNORMAL HIGH (ref 3.6–11.0)

## 2015-09-14 LAB — URINALYSIS COMPLETE WITH MICROSCOPIC (ARMC ONLY)
BILIRUBIN URINE: NEGATIVE
Glucose, UA: NEGATIVE mg/dL
Hgb urine dipstick: NEGATIVE
KETONES UR: NEGATIVE mg/dL
Nitrite: NEGATIVE
PH: 5 (ref 5.0–8.0)
Protein, ur: 30 mg/dL — AB
RBC / HPF: NONE SEEN RBC/hpf (ref 0–5)
Specific Gravity, Urine: 1.023 (ref 1.005–1.030)

## 2015-09-14 LAB — LACTIC ACID, PLASMA
Lactic Acid, Venous: 3.6 mmol/L (ref 0.5–2.0)
Lactic Acid, Venous: 2.6 mmol/L (ref 0.5–2.0)

## 2015-09-14 LAB — BASIC METABOLIC PANEL
ANION GAP: 15 (ref 5–15)
Anion gap: 10 (ref 5–15)
BUN: 64 mg/dL — ABNORMAL HIGH (ref 6–20)
BUN: 69 mg/dL — AB (ref 6–20)
CHLORIDE: 107 mmol/L (ref 101–111)
CHLORIDE: 117 mmol/L — AB (ref 101–111)
CO2: 22 mmol/L (ref 22–32)
CO2: 22 mmol/L (ref 22–32)
CREATININE: 1.95 mg/dL — AB (ref 0.44–1.00)
Calcium: 10.2 mg/dL (ref 8.9–10.3)
Calcium: 10.1 mg/dL (ref 8.9–10.3)
Creatinine, Ser: 1.96 mg/dL — ABNORMAL HIGH (ref 0.44–1.00)
GFR calc Af Amer: 26 mL/min — ABNORMAL LOW (ref >60)
GFR calc non Af Amer: 23 mL/min — ABNORMAL LOW (ref >60)
GFR calc non Af Amer: 23 mL/min — ABNORMAL LOW (ref >60)
GFR, EST AFRICAN AMERICAN: 26 mL/min — AB (ref >60)
GLUCOSE: 146 mg/dL — AB (ref 65–99)
Glucose, Bld: 159 mg/dL — ABNORMAL HIGH (ref 65–99)
POTASSIUM: 5.4 mmol/L — AB (ref 3.5–5.1)
Potassium: 4.1 mmol/L (ref 3.5–5.1)
Sodium: 144 mmol/L (ref 135–145)
Sodium: 149 mmol/L — ABNORMAL HIGH (ref 135–145)

## 2015-09-14 LAB — TROPONIN I
Troponin I: 0.58 ng/mL — ABNORMAL HIGH (ref <0.031)
Troponin I: 0.59 ng/mL — ABNORMAL HIGH (ref <0.031)
Troponin I: 0.72 ng/mL — ABNORMAL HIGH (ref <0.031)

## 2015-09-14 LAB — BLOOD GAS, VENOUS
Acid-base deficit: 0.7 mmol/L (ref 0.0–2.0)
BICARBONATE: 23.4 meq/L (ref 21.0–28.0)
PCO2 VEN: 36 mmHg — AB (ref 44.0–60.0)
PH VEN: 7.42 (ref 7.320–7.430)
Patient temperature: 37

## 2015-09-14 LAB — MRSA PCR SCREENING: MRSA by PCR: NEGATIVE

## 2015-09-14 LAB — BRAIN NATRIURETIC PEPTIDE: B Natriuretic Peptide: >4500 pg/mL — ABNORMAL HIGH (ref 0.0–100.0)

## 2015-09-14 MED ORDER — NITROGLYCERIN 0.4 MG SL SUBL: 0.4000 mg | SUBLINGUAL_TABLET | SUBLINGUAL | Status: DC | PRN

## 2015-09-14 MED ORDER — ONDANSETRON HCL 4 MG/2ML IJ SOLN: 4.0000 mg | Freq: Four times a day (QID) | INTRAMUSCULAR | Status: DC | PRN

## 2015-09-14 MED ORDER — ASPIRIN 81 MG PO CHEW: 81.0000 mg | CHEWABLE_TABLET | Freq: Every day | ORAL | Status: DC

## 2015-09-14 MED ORDER — LEVOFLOXACIN IN D5W 500 MG/100ML IV SOLN
500.0000 mg | INTRAVENOUS | Status: DC
  Filled 2015-09-14: qty 100

## 2015-09-14 MED ORDER — SODIUM POLYSTYRENE SULFONATE 15 GM/60ML PO SUSP
30.0000 g | Freq: Once | ORAL | Status: AC
  Administered 2015-09-14: 30 g via ORAL
  Filled 2015-09-14: qty 120

## 2015-09-14 MED ORDER — FAMOTIDINE 20 MG PO TABS: 20.0000 mg | ORAL_TABLET | Freq: Every day | ORAL | Status: DC

## 2015-09-14 MED ORDER — SODIUM CHLORIDE 0.9 % IJ SOLN
3.0000 mL | Freq: Two times a day (BID) | INTRAMUSCULAR | Status: DC
Start: 2015-09-14 — End: 2015-09-17
  Administered 2015-09-14 – 2015-09-16 (×5): 3 mL via INTRAVENOUS

## 2015-09-14 MED ORDER — VANCOMYCIN HCL IN DEXTROSE 750-5 MG/150ML-% IV SOLN
750.0000 mg | Freq: Once | INTRAVENOUS | Status: AC
  Administered 2015-09-14: 750 mg via INTRAVENOUS
  Filled 2015-09-14: qty 150

## 2015-09-14 MED ORDER — LEVOFLOXACIN IN D5W 250 MG/50ML IV SOLN
250.0000 mg | Freq: Once | INTRAVENOUS | Status: DC
  Filled 2015-09-14: qty 50

## 2015-09-14 MED ORDER — SODIUM CHLORIDE 0.9 % IV SOLN
INTRAVENOUS | Status: DC
  Administered 2015-09-14 – 2015-09-15 (×2): via INTRAVENOUS

## 2015-09-14 MED ORDER — SODIUM CHLORIDE 0.9 % IV SOLN: 250.0000 mL | INTRAVENOUS | Status: DC | PRN

## 2015-09-14 MED ORDER — OXYMETAZOLINE HCL 0.05 % NA SOLN: 2.0000 | Freq: Two times a day (BID) | NASAL | Status: DC | PRN

## 2015-09-14 MED ORDER — CITALOPRAM HYDROBROMIDE 20 MG PO TABS: 40.0000 mg | ORAL_TABLET | Freq: Every day | ORAL | Status: DC

## 2015-09-14 MED ORDER — POTASSIUM CHLORIDE CRYS ER 20 MEQ PO TBCR: 20.0000 meq | EXTENDED_RELEASE_TABLET | Freq: Every day | ORAL | Status: DC

## 2015-09-14 MED ORDER — BISACODYL 10 MG RE SUPP: 10.0000 mg | RECTAL | Status: DC | PRN

## 2015-09-14 MED ORDER — HEPARIN SODIUM (PORCINE) 5000 UNIT/ML IJ SOLN
5000.0000 [IU] | Freq: Three times a day (TID) | INTRAMUSCULAR | Status: DC
Start: 2015-09-14 — End: 2015-09-17
  Administered 2015-09-14 – 2015-09-16 (×8): 5000 [IU] via SUBCUTANEOUS
  Filled 2015-09-14 (×7): qty 1

## 2015-09-14 MED ORDER — MAGNESIUM HYDROXIDE 400 MG/5ML PO SUSP: 30.0000 mL | Freq: Every day | ORAL | Status: DC | PRN

## 2015-09-14 MED ORDER — VANCOMYCIN HCL IN DEXTROSE 750-5 MG/150ML-% IV SOLN
750.0000 mg | INTRAVENOUS | Status: DC
  Administered 2015-09-15: 750 mg via INTRAVENOUS
  Filled 2015-09-14: qty 150

## 2015-09-14 MED ORDER — SODIUM CHLORIDE 0.9 % IJ SOLN: 3.0000 mL | INTRAMUSCULAR | Status: DC | PRN

## 2015-09-14 MED ORDER — SODIUM CHLORIDE 0.9 % IV BOLUS (SEPSIS)
500.0000 mL | Freq: Once | INTRAVENOUS | Status: AC
  Administered 2015-09-14: 500 mL via INTRAVENOUS

## 2015-09-14 MED ORDER — METOPROLOL SUCCINATE ER 50 MG PO TB24: 50.0000 mg | ORAL_TABLET | Freq: Every day | ORAL | Status: DC

## 2015-09-14 MED ORDER — QUETIAPINE FUMARATE 25 MG PO TABS: 25.0000 mg | ORAL_TABLET | Freq: Every day | ORAL | Status: DC

## 2015-09-14 MED ORDER — ATORVASTATIN CALCIUM 20 MG PO TABS: 20.0000 mg | ORAL_TABLET | Freq: Every day | ORAL | Status: DC

## 2015-09-14 MED ORDER — LEVOFLOXACIN IN D5W 500 MG/100ML IV SOLN
500.0000 mg | Freq: Once | INTRAVENOUS | Status: AC
  Administered 2015-09-14: 500 mg via INTRAVENOUS
  Filled 2015-09-14: qty 100

## 2015-09-14 MED ORDER — QUETIAPINE FUMARATE 25 MG PO TABS: 50.0000 mg | ORAL_TABLET | Freq: Two times a day (BID) | ORAL | Status: DC

## 2015-09-14 NOTE — Progress Notes (Signed)
81yo wf admitted from ED with pneumonia.  Alert, opens eyes to name, non verbal at this time.  Husband states she does not talk a lot.  No distress on 2LO2 per .  Cardiac monitor placed on pt.  Lungs diminished bil.  Abdomen benign.  SL lt ac flushes well.  IVF infusing rt hand.  Skin checked and verifed by Leslie Dales, RN.  Sacrum area red but no breakdown noted, pink foam applied.  Husband oriented to room and surroundings.  CB in reach, SR up x 2, bed alarm on.

## 2015-09-14 NOTE — ED Notes (Signed)
Pt from [facility] with reports from staff that pt has been progressively getting weaker over the past week.

## 2015-09-14 NOTE — H&P (Signed)
[facility] - [place] at [facility]   PATIENT NAME: [name]    MR#:  [id number]  DATE OF BIRTH:  06-15-1931  DATE OF ADMISSION:  Sep 18, 2015  PRIMARY CARE PHYSICIAN: Tillman Abide, MD   REQUESTING/REFERRING PHYSICIAN: Jeanmarie Plant, MD  CHIEF COMPLAINT:   Chief Complaint  Patient presents with  . Weakness    HISTORY OF PRESENT ILLNESS:  [name]  is a 81 y.o. female with a known history of CAD, chronic systolic heart failure and critical aortic stenosis. She cannot give a history due to dementia. the patient was sent to ED from  SNF due to generalized weakness. She was found hypoxic at 84% and is put on Oxygen 2 L. Chest x-ray show right lower lobe pneumonia. The patient was treated with Levaquin 1 dose in ED.  PAST MEDICAL HISTORY:   Past Medical History  Diagnosis Date  . Critical aortic valve stenosis     a. Relatively asymptomatic until 12/2013 - not a candidate for TAVR 2/2 dementia.  Marland Kitchen CAD (coronary artery disease)     a. s/p CABG 03/1997 at [facility] -  including LIMA to LAD, LRA to OM, SVG to RCA with open vein harvest left thigh.  . HTN (hypertension)   . Hyperlipidemia   . SVT (supraventricular tachycardia) (HCC)     a. s/p ablation [facility].  . Polio     childhood  . Urinary incontinence   . Fecal impaction (HCC) 12/24/2013  . History of UTI 12/24/2013    >100,000 col/mL Hafnia alvei  . Heart murmur   . History of blood transfusion 1998    "pretty sure she had to have blood when she had bypass surgery  . DJD (degenerative joint disease)   . Osteoarthritis   . Arthritis     "pretty bad"  . Depression     "related to son on combat duty in [place]"   . Moderate dementia     "at times it's pretty bad" (12/31/2013)  . Ischemic cardiomyopathy     a. 12/2013 Echo: EF 20-25%, diff HK, mild AI/Sev AS, mild MR, mod TR.  Marland Kitchen Chronic systolic CHF (congestive heart failure) (HCC)     a. 12/2013 Echo: EF 20-25%    PAST SURGICAL HISTORY:   Past  Surgical History  Procedure Laterality Date  . Abdominal hysterectomy    . Bunionectomy    . Ganglion cyst excision    . Cholecystectomy    . Coronary artery bypass graft  04/02/1997    CABG x3 at [facility] using LIMA to LAD, LRA to OM, SVG to RCA with open vein harvest left thigh  . Total knee arthroplasty Left   . Total knee arthroplasty Right   . Atrial tach ablation      [facility]  . Ankle arthroplasty Right ~ 19138    "polio"  . Tonsillectomy    . Carpal tunnel release Bilateral     "I think she's had both sides" (12/31/2013)  . Cataract extraction w/ intraocular lens  implant, bilateral Bilateral   . Cardiac catheterization      "more than 1"   . Coronary angioplasty    . Cardiac catheterization  12/2013    [facility]    SOCIAL HISTORY:   Social History  Substance Use Topics  . Smoking status: Never Smoker   . Smokeless tobacco: Never Used  . Alcohol Use: No    FAMILY HISTORY:   Family History  Problem Relation Age of Onset  .  Heart attack Father   . Heart attack Brother   . Heart attack Mother     DRUG ALLERGIES:   Allergies  Allergen Reactions  . Penicillins Other (See Comments)    Unknown  . Isosorbide Rash    REVIEW OF SYSTEMS:  Unable to obtain ROS due to dementia and confusion.  MEDICATIONS AT HOME:   Prior to Admission medications   Medication Sig Start Date End Date Taking? Authorizing Provider  acetaminophen (TYLENOL) 325 MG tablet Take 650 mg by mouth as needed.   Yes Historical Provider, MD  acetaminophen (TYLENOL) 650 MG CR tablet Take 650 mg by mouth 3 (three) times daily.   Yes Historical Provider, MD  aspirin 81 MG chewable tablet Chew 81 mg by mouth daily.   Yes Historical Provider, MD  atorvastatin (LIPITOR) 20 MG tablet Take 20 mg by mouth daily.   Yes Historical Provider, MD  bisacodyl (DULCOLAX) 10 MG suppository Place 10 mg rectally as needed for moderate constipation.   Yes Historical Provider, MD  citalopram (CELEXA) 40 MG tablet Take 40 mg  by mouth daily.   Yes Historical Provider, MD  furosemide (LASIX) 20 MG tablet Take 20 mg by mouth 2 (two) times daily. Also used and PRN for weight gain greater than 3lbs.   Yes Historical Provider, MD  Magnesium Hydroxide (MILK OF MAGNESIA PO) Take by mouth as needed.   Yes Historical Provider, MD  metoprolol succinate (TOPROL-XL) 50 MG 24 hr tablet Take 1 tablet (50 mg total) by mouth daily. Take with or immediately following a meal. 07/06/14  Yes Iran Ouch, MD  NON FORMULARY Med Pass 240 ml bid.   Yes Historical Provider, MD  oxymetazoline (AFRIN) 0.05 % nasal spray Place 2 sprays into both nostrils 2 (two) times daily as needed for congestion.   Yes Historical Provider, MD  potassium chloride SA (K-DUR,KLOR-CON) 20 MEQ tablet Take 20 mEq by mouth daily.   Yes Historical Provider, MD  QUEtiapine (SEROQUEL) 25 MG tablet Take 25 mg by mouth at bedtime.   Yes Historical Provider, MD  QUEtiapine (SEROQUEL) 50 MG tablet Take 50 mg by mouth 2 (two) times daily.   Yes Historical Provider, MD  ranitidine (ZANTAC) 150 MG tablet Take 150 mg by mouth as needed for heartburn.   Yes Historical Provider, MD  nitroGLYCERIN (NITROSTAT) 0.4 MG SL tablet Place 0.4 mg under the tongue every 5 (five) minutes as needed for chest pain.    Historical Provider, MD      VITAL SIGNS:  Blood pressure 110/57, pulse 100, temperature 100.1 F (37.8 C), temperature source Rectal, resp. rate 27, height 5' (1.524 m), weight 58.514 kg (129 lb), SpO2 96 %.  PHYSICAL EXAMINATION:  GENERAL:  81 y.o.-year-old patient lying in the bed with no acute distress.  EYES: Pupils equal, round, reactive to light and accommodation. No scleral icterus. Extraocular muscles intact.  HEENT: Head atraumatic, normocephalic. Oropharynx and nasopharynx clear.  NECK:  Supple, no jugular venous distention. No thyroid enlargement, no tenderness.  LUNGS: Normal breath sounds bilaterally, right-sided crackles. No use of accessory muscles of  respiration.  CARDIOVASCULAR: S1, S2 normal. No murmurs, rubs, or gallops.  ABDOMEN: Soft, nontender, nondistended. Bowel sounds present. No organomegaly or mass.  EXTREMITIES: No pedal edema, cyanosis, or clubbing.  NEUROLOGIC: Follow commands, unable to exam.  PSYCHIATRIC: The patient is demented. SKIN: No obvious rash, lesion, or ulcer.   LABORATORY PANEL:   CBC  Recent Labs Lab September 26, 2015 0807  WBC 14.3*  HGB 14.8  HCT 46.5  PLT 217   ------------------------------------------------------------------------------------------------------------------  Chemistries   Recent Labs Lab 09/07/2015 0807  NA 144  K 5.4*  CL 107  CO2 22  GLUCOSE 146*  BUN 64*  CREATININE 1.96*  CALCIUM 10.2   ------------------------------------------------------------------------------------------------------------------  Cardiac Enzymes  Recent Labs Lab 09/29/2015 0807  TROPONINI 0.58*   ------------------------------------------------------------------------------------------------------------------  RADIOLOGY:  Dg Chest 2 View  09/18/2015  CLINICAL DATA:  81 year old female with progressive weakness x1 week. Hypoxia. Initial encounter. EXAM: CHEST  2 VIEW COMPARISON:  06/08/2014 and earlier. FINDINGS: Seated AP and lateral views of the chest. Stable cardiomegaly. Sequelae of CABG. Stable mediastinal contours. Increased pulmonary vascularity. No pneumothorax. Small pleural effusions suspected greater on the right. There is new confluent right upper lobe opacity, patchy and nodular. Osteopenia with advanced scoliosis and degenerative changes in the spine. Widespread calcified atherosclerosis of the aorta. IMPRESSION: 1. Right upper lobe pneumonia. 2. Increased pulmonary vascular congestion. Small right greater than left pleural effusions might be increased. 3. Chronic cardiomegaly. Electronically Signed   By: Odessa Fleming M.D.   On: 09/29/2015 08:32    EKG:   Orders placed or performed during the  hospital encounter of 09/07/2015  . ED EKG  . ED EKG  . EKG 12-Lead  . EKG 12-Lead    IMPRESSION AND PLAN:   Sepsis with Right side PNA (HAP) Acute respiratory failure with Hypoxia UTI Leukocytosis ARF hyperkalemia Elevated troponin, due to demanding ischemia. Lactic acidosis Chronic systolic CHF 20-25%. CAD Aortic stenosis   The patient is admitted to telemetry floor. I will continue Levaquin and add vancomycin. Follow-up CBC and cultures. Nebulizer when necessary. Continue oxygen. Hold lasix and potassium, give gentle rehydration and kayexalate,  f/u BMP and lactic acid. F/u troponin, continue aspirin, Lipitor and follow-up cardiology.      All the records are reviewed and case discussed with ED provider. Management plans discussed with the patient's husband and he is in agreement.  CODE STATUS: DNR  TOTAL TIME TAKING CARE OF THIS PATIENT: 58 minutes.    Shaune Pollack M.D on 09/19/2015 at 10:41 AM  Between 7am to 6pm - Pager - [phone]  After 6pm go to [email or web address] - password [email or web address] [facility]  [facility]  Office  [phone]  CC: Primary care physician; Tillman Abide, MD

## 2015-09-14 NOTE — ED Provider Notes (Addendum)
----------------------------------------- 8:28 AM on 10/11/2015 -----------------------------------------  [facility] Emergency Department Provider Note  ____________________________________________   I have reviewed the triage vital signs and the nursing notes. Patient cannot give a history secondary to dementia   HISTORY  Chief Complaint Weakness    HPI [name] is a 81 y.o. female coronary artery disease, chronic systolic heart failure and critical aortic stenosis. The patient has a history of coronary artery disease status post remote CABG in the 1990s. Patient is at baseline significantly demented and at her baseline per family. She cannot give a history. History is per EMS and family. The patient knows her name but does not recognize family members and this is her baseline. He is DNR/DNI, the patient presents today with apparently generalized weakness. She does not a baseline will ambulate. But she's been feeling weaker apparently over the last week or so. Family noted that she was breathing more rapidly than normal a few days ago and they checked her oxygen was 92%. When EMS arrived was 84%. Patient is not on oxygen at home.  Past Medical History  Diagnosis Date  . Critical aortic valve stenosis     a. Relatively asymptomatic until 12/2013 - not a candidate for TAVR 2/2 dementia.  Marland Kitchen CAD (coronary artery disease)     a. s/p CABG 03/1997 at [facility] -  including LIMA to LAD, LRA to OM, SVG to RCA with open vein harvest left thigh.  . HTN (hypertension)   . Hyperlipidemia   . SVT (supraventricular tachycardia)     a. s/p ablation [facility].  . Polio     childhood  . Urinary incontinence   . Fecal impaction 12/24/2013  . History of UTI 12/24/2013    >100,000 col/mL Hafnia alvei  . Heart murmur   . History of blood transfusion 1998    "pretty sure she had to have blood when she had bypass surgery  . DJD (degenerative joint disease)   . Osteoarthritis   .  Arthritis     "pretty bad"  . Depression     "related to son on combat duty in [place]"   . Moderate dementia     "at times it's pretty bad" (12/31/2013)  . Ischemic cardiomyopathy     a. 12/2013 Echo: EF 20-25%, diff HK, mild AI/Sev AS, mild MR, mod TR.  Marland Kitchen Chronic systolic CHF (congestive heart failure)     a. 12/2013 Echo: EF 20-25%    Patient Active Problem List   Diagnosis Date Noted  . Chronic systolic heart failure (HCC) 07/06/2014  . Aortic valve disorders 01/23/2014  . Osteoarthritis   . Aortic stenosis 12/29/2013  . Non-ischemic cardiomyopathy (HCC) 12/29/2013  . Coronary atherosclerosis of native coronary artery 12/29/2013  . Dementia   . Fecal impaction (HCC) 12/24/2013  . UTI (urinary tract infection) 12/24/2013  . S/P CABG x 3 04/02/1997    Past Surgical History  Procedure Laterality Date  . Abdominal hysterectomy    . Bunionectomy    . Ganglion cyst excision    . Cholecystectomy    . Coronary artery bypass graft  04/02/1997    CABG x3 at [facility] using LIMA to LAD, LRA to OM, SVG to RCA with open vein harvest left thigh  . Total knee arthroplasty Left   . Total knee arthroplasty Right   . Atrial tach ablation      [facility]  . Ankle arthroplasty Right ~ 19138    "polio"  . Tonsillectomy    .  Carpal tunnel release Bilateral     "I think she's had both sides" (12/31/2013)  . Cataract extraction w/ intraocular lens  implant, bilateral Bilateral   . Cardiac catheterization      "more than 1"   . Coronary angioplasty    . Cardiac catheterization  12/2013    [facility]    Current Outpatient Rx  Name  Route  Sig  Dispense  Refill  . acetaminophen (TYLENOL) 325 MG tablet   Oral   Take 650 mg by mouth as needed.         Marland Kitchen acetaminophen (TYLENOL) 650 MG CR tablet   Oral   Take 650 mg by mouth 3 (three) times daily.         Marland Kitchen aspirin 81 MG tablet   Oral   Take 81 mg by mouth daily.         Marland Kitchen atorvastatin (LIPITOR) 20 MG tablet   Oral   Take 20 mg by mouth daily.          . bisacodyl (DULCOLAX) 10 MG suppository   Rectal   Place 10 mg rectally as needed for moderate constipation.         . citalopram (CELEXA) 40 MG tablet   Oral   Take 40 mg by mouth daily.         . furosemide (LASIX) 20 MG tablet   Oral   Take 20 mg by mouth 2 (two) times daily.         Marland Kitchen LORazepam (ATIVAN) 0.5 MG tablet   Oral   Take 0.5 mg by mouth as needed for anxiety.         . Magnesium Hydroxide (MILK OF MAGNESIA PO)   Oral   Take by mouth as needed.         . metoprolol succinate (TOPROL-XL) 50 MG 24 hr tablet   Oral   Take 1 tablet (50 mg total) by mouth daily. Take with or immediately following a meal.   90 tablet   3   . morphine (ROXANOL) 20 MG/ML concentrated solution   Oral   Take 20 mg by mouth as needed for severe pain.         . nitroGLYCERIN (NITROSTAT) 0.4 MG SL tablet   Sublingual   Place 0.4 mg under the tongue every 5 (five) minutes as needed for chest pain.         . NON FORMULARY      Med Pass 240 ml bid.         . potassium chloride SA (K-DUR,KLOR-CON) 20 MEQ tablet   Oral   Take 20 mEq by mouth daily.         . QUEtiapine (SEROQUEL) 50 MG tablet   Oral   Take 50 mg by mouth 2 (two) times daily.         . ranitidine (ZANTAC) 150 MG tablet   Oral   Take 150 mg by mouth as needed for heartburn.           Allergies Penicillins and Isosorbide  Family History  Problem Relation Age of Onset  . Heart attack Father   . Heart attack Brother   . Heart attack Mother     Social History Social History  Substance Use Topics  . Smoking status: Never Smoker   . Smokeless tobacco: Never Used  . Alcohol Use: No    Review of Systems Cannot obtain secondary patient dementia ____________________________________________   PHYSICAL EXAM:  VITAL  SIGNS: ED Triage Vitals  Enc Vitals Group     BP 2015/09/20 0814 110/57 mmHg     Pulse Rate 09-20-15 0814 100     Resp Sep 20, 2015 0814 27     Temp 09/20/15 0814  96.8 F (36 C)     Temp Source 20-Sep-2015 0814 Axillary     SpO2 20-Sep-2015 0814 86 %     Weight --      Height --      Head Cir --      Peak Flow --      Pain Score --      Pain Loc --      Pain Edu? --      Excl. in GC? --     Constitutional: She is in no acute distress she opens her eyes to verbal stimuli and she will say her name sometimes. Eyes: Conjunctivae are normal. PERRL. EOMI. Head: Atraumatic. Nose: No congestion/rhinnorhea. Mouth/Throat: Mucous membranes are moist.  Oropharynx non-erythematous. Neck: No stridor.   Nontender with no meningismus Cardiovascular: Normal rate, regular rhythm. Holosystolic murmur appreciated.  Respiratory: Normal respiratory effort.  No retractions. Occasional right-sided rhonchi appreciated Abdominal: Soft and nontender. No distention. No guarding no rebound Back:  There is no focal tenderness or step off there is no midline tenderness there are no lesions noted. there is no CVA tenderness Musculoskeletal: No lower extremity tenderness. No joint effusions, no DVT signs strong distal pulses no edema Neurologic:  Limited exam second to patient mental status. No gross focal neurologic deficits are appreciated.  Skin:  Skin is warm, dry and intact. No rash noted. Psychiatric: Mood and affect are normal. Speech and behavior are normal.  ____________________________________________   LABS (all labs ordered are listed, but only abnormal results are displayed)  Labs Reviewed  URINE CULTURE  BLOOD GAS, VENOUS  URINALYSIS COMPLETEWITH MICROSCOPIC ([facility] ONLY)  CBC WITH DIFFERENTIAL/PLATELET  BRAIN NATRIURETIC PEPTIDE  TROPONIN I  LACTIC ACID, PLASMA  LACTIC ACID, PLASMA  BASIC METABOLIC PANEL   ____________________________________________  EKG  I personally interpreted any EKGs ordered by me or triage Normal sinus rhythm at 87 bpm normal axis, downsloping T waves laterally consistent with strain pattern or ischemia. No acute ST elevation,  also inferiorly there is downsloping T waves. ____________________________________________  RADIOLOGY  I reviewed any imaging ordered by me or triage that were performed during my shift ____________________________________________   PROCEDURES  Procedure(s) performed: None  Critical Care performed: CRITICAL CARE Performed by: Jeanmarie Plant   Total critical care time: 40 minutes  Critical care time was exclusive of separately billable procedures and treating other patients.  Critical care was necessary to treat or prevent imminent or life-threatening deterioration.  Critical care was time spent personally by me on the following activities: development of treatment plan with patient and/or surrogate as well as nursing, discussions with consultants, evaluation of patient's response to treatment, examination of patient, obtaining history from patient or surrogate, ordering and performing treatments and interventions, ordering and review of laboratory studies, ordering and review of radiographic studies, pulse oximetry and re-evaluation of patient's condition.   ____________________________________________   INITIAL IMPRESSION / ASSESSMENT AND PLAN / ED COURSE  Pertinent labs & imaging results that were available during my care of the patient were reviewed by me and considered in my medical decision making (see chart for details).  Patient with a history of PE for 40%, atrial stenosis not amenable to intervention, on Lasix, with significant dementia, presents today with hypoxia of  unknown etiology. Differential course includes CHF or pneumonia. We will institute a broad workup and assess for signs of weakness. Very limited history and exam second to patient dementia.  ----------------------------------------- 9:06 AM on 09/16/2015 -----------------------------------------  Patient with UTI and pneumonia giving her ginger IV fluids lactate is noted not markedly acidotic however on  VBG, will admit to hospital ____________________________________________   FINAL CLINICAL IMPRESSION(S) / ED DIAGNOSES  Final diagnoses:  None     Jeanmarie Plant, MD 09/20/2015 0831  Jeanmarie Plant, MD 09/28/2015 9767  Jeanmarie Plant, MD 10/03/2015 [phone]

## 2015-09-14 NOTE — Care Management (Signed)
Patient presents from [facility] skilled nursing level of care under long term care plan of treatment

## 2015-09-14 NOTE — Consult Note (Signed)
Cardiology Consultation Note  Patient ID: [name], MRN: [id number], DOB/AGE: [birth year] 81 y.o. Admit date: 10-03-2015   Date of Consult: 03-Oct-2015 Primary Physician: Tillman Abide, MD Primary Cardiologist: Terrilee Files  Chief Complaint: weakness Reason for Consult: elevated troponin, severe aortic valve stenosis  HPI: 81 y.o. female with h/o dementia, prior urinary tract infections, coronary artery disease, bypass surgery in the 1990s, critical aortic valve stenosis, SVT with ablation at [facility], evaluated for aortic valve surgery in 12/2014 at the time of a urinary tract infection, with cardiac catheterization for elevated troponin at that time showing patent grafts, ejection fraction 40%, not a surgical candidate for her aortic valve secondary to dementia, sent to ED from SNF due to generalized weakness.   She was found hypoxic at 84% and is put on Oxygen 2 L. Chest x-ray show right lower lobe pneumonia. The patient was treated with Levaquin 1 dose in ED.  Husband reports that she had nausea, vomiting 3 days ago on Saturday, symptoms continued on Sunday. Presented to the hospital with worsening weakness, vomiting He denies she is having any chest pain or shortness of breath at baseline, otherwise has been doing well at the nursing home    Past Medical History  Diagnosis Date  . Critical aortic valve stenosis     a. Relatively asymptomatic until 12/2013 - not a candidate for TAVR 2/2 dementia.  Marland Kitchen CAD (coronary artery disease)     a. s/p CABG 03/1997 at [facility] -  including LIMA to LAD, LRA to OM, SVG to RCA with open vein harvest left thigh.  . HTN (hypertension)   . Hyperlipidemia   . SVT (supraventricular tachycardia) (HCC)     a. s/p ablation [facility].  . Polio     childhood  . Urinary incontinence   . Fecal impaction (HCC) 12/24/2013  . History of UTI 12/24/2013    >100,000 col/mL Hafnia alvei  . Heart murmur   . History of blood transfusion 1998    "pretty sure she had to  have blood when she had bypass surgery  . DJD (degenerative joint disease)   . Osteoarthritis   . Arthritis     "pretty bad"  . Depression     "related to son on combat duty in [place]"   . Moderate dementia     "at times it's pretty bad" (12/31/2013)  . Ischemic cardiomyopathy     a. 12/2013 Echo: EF 20-25%, diff HK, mild AI/Sev AS, mild MR, mod TR.  Marland Kitchen Chronic systolic CHF (congestive heart failure) (HCC)     a. 12/2013 Echo: EF 20-25%      Most Recent Cardiac Studies:    Surgical History:  Past Surgical History  Procedure Laterality Date  . Abdominal hysterectomy    . Bunionectomy    . Ganglion cyst excision    . Cholecystectomy    . Coronary artery bypass graft  04/02/1997    CABG x3 at [facility] using LIMA to LAD, LRA to OM, SVG to RCA with open vein harvest left thigh  . Total knee arthroplasty Left   . Total knee arthroplasty Right   . Atrial tach ablation      [facility]  . Ankle arthroplasty Right ~ 19138    "polio"  . Tonsillectomy    . Carpal tunnel release Bilateral     "I think she's had both sides" (12/31/2013)  . Cataract extraction w/ intraocular lens  implant, bilateral Bilateral   . Cardiac catheterization      "  more than 1"   . Coronary angioplasty    . Cardiac catheterization  12/2013    [facility]     Home Meds: Prior to Admission medications   Medication Sig Start Date End Date Taking? Authorizing Provider  acetaminophen (TYLENOL) 325 MG tablet Take 650 mg by mouth as needed.   Yes Historical Provider, MD  acetaminophen (TYLENOL) 650 MG CR tablet Take 650 mg by mouth 3 (three) times daily.   Yes Historical Provider, MD  aspirin 81 MG chewable tablet Chew 81 mg by mouth daily.   Yes Historical Provider, MD  atorvastatin (LIPITOR) 20 MG tablet Take 20 mg by mouth daily.   Yes Historical Provider, MD  bisacodyl (DULCOLAX) 10 MG suppository Place 10 mg rectally as needed for moderate constipation.   Yes Historical Provider, MD  citalopram (CELEXA) 40 MG tablet Take 40 mg  by mouth daily.   Yes Historical Provider, MD  furosemide (LASIX) 20 MG tablet Take 20 mg by mouth 2 (two) times daily. Also used and PRN for weight gain greater than 3lbs.   Yes Historical Provider, MD  Magnesium Hydroxide (MILK OF MAGNESIA PO) Take by mouth as needed.   Yes Historical Provider, MD  metoprolol succinate (TOPROL-XL) 50 MG 24 hr tablet Take 1 tablet (50 mg total) by mouth daily. Take with or immediately following a meal. 07/06/14  Yes Iran Ouch, MD  NON FORMULARY Med Pass 240 ml bid.   Yes Historical Provider, MD  oxymetazoline (AFRIN) 0.05 % nasal spray Place 2 sprays into both nostrils 2 (two) times daily as needed for congestion.   Yes Historical Provider, MD  potassium chloride SA (K-DUR,KLOR-CON) 20 MEQ tablet Take 20 mEq by mouth daily.   Yes Historical Provider, MD  QUEtiapine (SEROQUEL) 25 MG tablet Take 25 mg by mouth at bedtime.   Yes Historical Provider, MD  QUEtiapine (SEROQUEL) 50 MG tablet Take 50 mg by mouth 2 (two) times daily.   Yes Historical Provider, MD  ranitidine (ZANTAC) 150 MG tablet Take 150 mg by mouth as needed for heartburn.   Yes Historical Provider, MD  nitroGLYCERIN (NITROSTAT) 0.4 MG SL tablet Place 0.4 mg under the tongue every 5 (five) minutes as needed for chest pain.    Historical Provider, MD    Inpatient Medications:  . aspirin  81 mg Oral Daily  . atorvastatin  20 mg Oral QPC supper  . citalopram  40 mg Oral Daily  . famotidine  20 mg Oral Daily  . heparin subcutaneous  5,000 Units Subcutaneous 3 times per day  . levofloxacin (LEVAQUIN) IV  250 mg Intravenous Once  . [START ON 09/16/2015] levofloxacin (LEVAQUIN) IV  500 mg Intravenous Q48H  . metoprolol succinate  50 mg Oral Daily  . QUEtiapine  25 mg Oral QHS  . QUEtiapine  50 mg Oral BID WC  . sodium chloride  3 mL Intravenous Q12H  . [START ON 09/15/2015] vancomycin  750 mg Intravenous Q48H   . sodium chloride 75 mL/hr at 10/05/2015 1300    Allergies:  Allergies  Allergen  Reactions  . Penicillins Other (See Comments)    Unknown  . Isosorbide Rash    Social History   Social History  . Marital Status: Married    Spouse Name: N/A  . Number of Children: N/A  . Years of Education: N/A   Occupational History  . Retired    Social History Main Topics  . Smoking status: Never Smoker   . Smokeless tobacco: Never  Used  . Alcohol Use: No  . Drug Use: No  . Sexual Activity: No   Other Topics Concern  . Not on file   Social History Narrative     Family History  Problem Relation Age of Onset  . Heart attack Father   . Heart attack Brother   . Heart attack Mother      Review of Systems: Review of Systems  Unable to perform ROS  patient has underlying dementia and nonverbal  Labs:  Recent Labs  10/05/2015 0807 Oct 05, 2015 1314  TROPONINI 0.58* 0.59*   Lab Results  Component Value Date   WBC 14.3* 2015-10-05   HGB 14.8 2015/10/05   HCT 46.5 10/05/2015   MCV 95.8 October 05, 2015   PLT 217 10-05-15    Recent Labs Lab 10-05-2015 1314  NA 149*  K 4.1  CL 117*  CO2 22  BUN 69*  CREATININE 1.95*  CALCIUM 10.1  GLUCOSE 159*   Lab Results  Component Value Date   CHOL 143 11/25/2012   HDL 67* 11/25/2012   LDLCALC 59 11/25/2012   TRIG 84 11/25/2012   No results found for: DDIMER  Radiology/Studies:  Dg Chest 2 View  10/05/2015  CLINICAL DATA:  81 year old female with progressive weakness x1 week. Hypoxia. Initial encounter. EXAM: CHEST  2 VIEW COMPARISON:  06/08/2014 and earlier. FINDINGS: Seated AP and lateral views of the chest. Stable cardiomegaly. Sequelae of CABG. Stable mediastinal contours. Increased pulmonary vascularity. No pneumothorax. Small pleural effusions suspected greater on the right. There is new confluent right upper lobe opacity, patchy and nodular. Osteopenia with advanced scoliosis and degenerative changes in the spine. Widespread calcified atherosclerosis of the aorta. IMPRESSION: 1. Right upper lobe pneumonia. 2.  Increased pulmonary vascular congestion. Small right greater than left pleural effusions might be increased. 3. Chronic cardiomegaly. Electronically Signed   By: Odessa Fleming M.D.   On: 10-05-15 08:32    EKG: Normal sinus rhythm with diffuse ST and T wave abnormality  Weights: Filed Weights   10/05/15 0830 10-05-15 1145  Weight: 129 lb (58.514 kg) 108 lb 6.4 oz (49.17 kg)     Physical Exam: Blood pressure 118/79, pulse 98, temperature 98.5 F (36.9 C), temperature source Oral, resp. rate 20, height 5' (1.524 m), weight 108 lb 6.4 oz (49.17 kg), SpO2 96 %. Body mass index is 21.17 kg/(m^2). General: Well developed, well nourished, in no acute distress. Head: Normocephalic, atraumatic, sclera non-icteric, no xanthomas, nares are without discharge.  Neck: Negative for carotid bruits. JVD not elevated. Lungs: Clear bilaterally to auscultation without wheezes, rales, or rhonchi. Breathing is unlabored. Heart: RRR with S1 S2. No murmurs, rubs, or gallops appreciated. Abdomen: Soft, non-tender, non-distended with normoactive bowel sounds. No hepatomegaly. No rebound/guarding. No obvious abdominal masses. Msk:  Strength and tone appear normal for age. Extremities: No clubbing or cyanosis. No edema.  Distal pedal pulses are 2+ and equal bilaterally. Neuro: Alert and oriented X 3. No facial asymmetry. No focal deficit. Moves all extremities spontaneously. Psych:  Responds to questions appropriately with a normal affect.    Assessment and Plan:   1) Elevated troponin Underlying coronary artery disease, patent grafts by catheterization 12/2014 Similar presentation at that time with urinary tract infection, elevated troponin Elevated cardiac enzymes likely exacerbated by urinary tract infection, tachycardia, valve stenosis Currently does not appear to be in any distress Discussed findings with husband, he prefers conservative measures No further testing planned at this time, would trend her  troponins  2) aortic valve  stenosis Critical valve stenosis as detailed on prior workup She is not a candidate for any intervention due to advanced dementia. stable over the last 6 months.  Her mobility is limited and she is mostly wheelchair bound.   3) CAD, CABG: Prior cardiac catheterization 2/16 with patent grafts Husband reports she is having no chest pain or ischemia symptoms We'll monitor troponin for now No further workup  4) chronic systolic CHF Lasix held Currently on low-dose fluids even her infection Would hold fluids once her oral intake improved to her baseline and renal function improves  5) acute on chronic kidney failure Suspected dehydration Currently on normal saline at 75 cc per hour    Signed, Dossie Arbour, MD [facility] 2015-10-12, 5:49 PM

## 2015-09-14 NOTE — Progress Notes (Signed)
ANTIBIOTIC CONSULT NOTE - INITIAL  Pharmacy Consult for Zosyn/Vancomycin/renal adjust antibiotics Indication: HCAP  Allergies  Allergen Reactions  . Penicillins Other (See Comments)    Unknown  . Isosorbide Rash    Patient Measurements: Height: 5' (152.4 cm) Weight: 108 lb 6.4 oz (49.17 kg) IBW/kg (Calculated) : 45.5 Adjusted Body Weight: 47  Vital Signs: Temp: 98.5 F (36.9 C) (11/08 1145) Temp Source: Oral (11/08 1145) BP: 118/79 mmHg (11/08 1145) Pulse Rate: 98 (11/08 1145) Intake/Output from previous day:   Intake/Output from this shift: Total I/O In: 103 [I.V.:3; IV Piggyback:100] Out: -   Labs:  Recent Labs  09/09/2015 0807  WBC 14.3*  HGB 14.8  PLT 217  CREATININE 1.96*   Estimated Creatinine Clearance: 15.9 mL/min (by C-G formula based on Cr of 1.96). No results for input(s): VANCOTROUGH, VANCOPEAK, VANCORANDOM, GENTTROUGH, GENTPEAK, GENTRANDOM, TOBRATROUGH, TOBRAPEAK, TOBRARND, AMIKACINPEAK, AMIKACINTROU, AMIKACIN in the last 72 hours.   Microbiology: No results found for this or any previous visit (from the past 720 hour(s)).  Medical History: Past Medical History  Diagnosis Date  . Critical aortic valve stenosis     a. Relatively asymptomatic until 12/2013 - not a candidate for TAVR 2/2 dementia.  Marland Kitchen CAD (coronary artery disease)     a. s/p CABG 03/1997 at [facility] -  including LIMA to LAD, LRA to OM, SVG to RCA with open vein harvest left thigh.  . HTN (hypertension)   . Hyperlipidemia   . SVT (supraventricular tachycardia) (HCC)     a. s/p ablation [facility].  . Polio     childhood  . Urinary incontinence   . Fecal impaction (HCC) 12/24/2013  . History of UTI 12/24/2013    >100,000 col/mL Hafnia alvei  . Heart murmur   . History of blood transfusion 1998    "pretty sure she had to have blood when she had bypass surgery  . DJD (degenerative joint disease)   . Osteoarthritis   . Arthritis     "pretty bad"  . Depression     "related to son on combat  duty in [place]"   . Moderate dementia     "at times it's pretty bad" (12/31/2013)  . Ischemic cardiomyopathy     a. 12/2013 Echo: EF 20-25%, diff HK, mild AI/Sev AS, mild MR, mod TR.  Marland Kitchen Chronic systolic CHF (congestive heart failure) (HCC)     a. 12/2013 Echo: EF 20-25%    Medications:  Scheduled:  . aspirin  81 mg Oral Daily  . atorvastatin  20 mg Oral QPC supper  . citalopram  40 mg Oral Daily  . famotidine  20 mg Oral Daily  . levofloxacin (LEVAQUIN) IV  250 mg Intravenous Once  . [START ON 09/16/2015] levofloxacin (LEVAQUIN) IV  500 mg Intravenous Q48H  . metoprolol succinate  50 mg Oral Daily  . potassium chloride SA  20 mEq Oral Daily  . QUEtiapine  25 mg Oral QHS  . QUEtiapine  50 mg Oral BID WC  . sodium chloride  3 mL Intravenous Q12H  . vancomycin  750 mg Intravenous Once  . [START ON 09/15/2015] vancomycin  750 mg Intravenous Q48H   Infusions:  . sodium chloride 75 mL/hr at 09/27/2015 1300   Assessment: Patient is an 81 yo female admitted for treatment of HCAP and UTI.    SCr: 1.96, est CrCl~15.9 mL/min, ke: 0.018, t1/2: 38.5 h, Vd: 34.4 L  Goal of Therapy:  Vancomycin trough level 15-20 mcg/ml  Plan:  Will order Vancomycin  750 mg IV once with stacked dose ~21 hours later at 1100 on 11/9.  Will then start patient on maintenance dosing of Vancomycin 750 mg IV q48h. Will check trough prior to dose on 11/13 at 1030 (not at steady state).     Patient received Levaquin 500 mg IV once in ED at 1223.  Will give an extra 250 mg dose to make total daily dose for today of 750 mg.  Will then renally adjust to Levaquin 500 mg IV q48h.  Will follow renal function closely for adjustments in antibiotic dosing.    Pharmacy will continue to follow.   Clarisa Schools, PharmD Clinical Pharmacist 10-02-2015

## 2015-09-15 ENCOUNTER — Inpatient Hospital Stay: Payer: Medicare Other

## 2015-09-15 LAB — CBC
HEMATOCRIT: 41.1 % (ref 35.0–47.0)
HEMOGLOBIN: 13.1 g/dL (ref 12.0–16.0)
MCH: 30.4 pg (ref 26.0–34.0)
MCHC: 31.8 g/dL — ABNORMAL LOW (ref 32.0–36.0)
MCV: 95.7 fL (ref 80.0–100.0)
Platelets: 214 10*3/uL (ref 150–440)
RBC: 4.3 MIL/uL (ref 3.80–5.20)
RDW: 16.1 % — ABNORMAL HIGH (ref 11.5–14.5)
WBC: 17.7 10*3/uL — ABNORMAL HIGH (ref 3.6–11.0)

## 2015-09-15 LAB — BASIC METABOLIC PANEL
ANION GAP: 15 (ref 5–15)
BUN: 73 mg/dL — ABNORMAL HIGH (ref 6–20)
CALCIUM: 9.6 mg/dL (ref 8.9–10.3)
CO2: 20 mmol/L — AB (ref 22–32)
Chloride: 118 mmol/L — ABNORMAL HIGH (ref 101–111)
Creatinine, Ser: 1.74 mg/dL — ABNORMAL HIGH (ref 0.44–1.00)
GFR, EST AFRICAN AMERICAN: 30 mL/min — AB (ref >60)
GFR, EST NON AFRICAN AMERICAN: 26 mL/min — AB (ref >60)
Glucose, Bld: 128 mg/dL — ABNORMAL HIGH (ref 65–99)
POTASSIUM: 3.6 mmol/L (ref 3.5–5.1)
Sodium: 153 mmol/L — ABNORMAL HIGH (ref 135–145)

## 2015-09-15 LAB — TROPONIN I: Troponin I: 0.65 ng/mL — ABNORMAL HIGH (ref <0.031)

## 2015-09-15 LAB — LACTIC ACID, PLASMA: LACTIC ACID, VENOUS: 2.4 mmol/L — AB (ref 0.5–2.0)

## 2015-09-15 MED ORDER — LEVALBUTEROL HCL 1.25 MG/0.5ML IN NEBU
1.2500 mg | INHALATION_SOLUTION | Freq: Four times a day (QID) | RESPIRATORY_TRACT | Status: DC
  Administered 2015-09-15 – 2015-09-17 (×6): 1.25 mg via RESPIRATORY_TRACT
  Filled 2015-09-15 (×7): qty 0.5

## 2015-09-15 MED ORDER — SODIUM CHLORIDE 0.45 % IV SOLN
INTRAVENOUS | Status: DC
  Administered 2015-09-15: 08:00:00 via INTRAVENOUS

## 2015-09-15 MED ORDER — FUROSEMIDE 10 MG/ML IJ SOLN
20.0000 mg | Freq: Once | INTRAMUSCULAR | Status: AC
  Administered 2015-09-15: 20 mg via INTRAVENOUS
  Filled 2015-09-15: qty 2

## 2015-09-15 NOTE — Progress Notes (Signed)
The patient has labor breathing.  B/l lung sounds wheezing.  Discontinue 1/2NS iv. Lasix 20 mg iv one dose, CXR,  closely monitor VS. May start levophed drip if hypotension.  I discussed with her husband and son about her critical condition and poor prognosis.  Additional care time: 35 min.

## 2015-09-15 NOTE — Progress Notes (Signed)
Pt breathing is labored, exp wheeze and rhonci are present. MD notified. Orders to discontinue IVF, svns and repeat cxr. Will continue to assess.

## 2015-09-15 NOTE — Progress Notes (Signed)
Pt gets choked and coughs after drinking water. MD notified. Speech consult order and make pt NPO until speech sees.

## 2015-09-15 NOTE — Progress Notes (Addendum)
[facility] [place] at [facility]   PATIENT NAME: Marcedes Tech    MR#:  [id number]  DATE OF BIRTH:  May 25, 1931  SUBJECTIVE:  CHIEF COMPLAINT:   Chief Complaint  Patient presents with  . Weakness   the patient is demented and noncommunicative.  REVIEW OF SYSTEMS:  Unable to obtain  DRUG ALLERGIES:   Allergies  Allergen Reactions  . Penicillins Other (See Comments)    Unknown  . Isosorbide Rash    VITALS:  Blood pressure 98/87, pulse 102, temperature 98 F (36.7 C), temperature source Oral, resp. rate 18, height 5' (1.524 m), weight 49.17 kg (108 lb 6.4 oz), SpO2 98 %.  PHYSICAL EXAMINATION:  GENERAL:  81 y.o.-year-old patient lying in the bed with no acute distress.  EYES: Pupils equal, round, reactive to light and accommodation. No scleral icterus.  HEENT: Head atraumatic, normocephalic. Moist oral mucosa.  NECK:  Supple, no jugular venous distention. No thyroid enlargement, no tenderness.  LUNGS: Normal breath sounds bilaterally, no wheezing, right-sided crackles. No use of accessory muscles of respiration.  CARDIOVASCULAR: S1, S2 normal. No murmurs, rubs, or gallops.  ABDOMEN: Soft, nontender, nondistended. Bowel sounds present. No organomegaly or mass.  EXTREMITIES: No pedal edema, cyanosis, or clubbing.  NEUROLOGIC: Does not follow commands, unable to exam.  PSYCHIATRIC: The patient is demented.  SKIN: No obvious rash, lesion, or ulcer.    LABORATORY PANEL:   CBC  Recent Labs Lab 09/15/15 0455  WBC 17.7*  HGB 13.1  HCT 41.1  PLT 214   ------------------------------------------------------------------------------------------------------------------  Chemistries   Recent Labs Lab 09/15/15 0455  NA 153*  K 3.6  CL 118*  CO2 20*  GLUCOSE 128*  BUN 73*  CREATININE 1.74*  CALCIUM 9.6   ------------------------------------------------------------------------------------------------------------------  Cardiac  Enzymes  Recent Labs Lab 09/15/15  TROPONINI 0.65*   ------------------------------------------------------------------------------------------------------------------  RADIOLOGY:  Dg Chest 2 View  10/05/2015  CLINICAL DATA:  81 year old female with progressive weakness x1 week. Hypoxia. Initial encounter. EXAM: CHEST  2 VIEW COMPARISON:  06/08/2014 and earlier. FINDINGS: Seated AP and lateral views of the chest. Stable cardiomegaly. Sequelae of CABG. Stable mediastinal contours. Increased pulmonary vascularity. No pneumothorax. Small pleural effusions suspected greater on the right. There is new confluent right upper lobe opacity, patchy and nodular. Osteopenia with advanced scoliosis and degenerative changes in the spine. Widespread calcified atherosclerosis of the aorta. IMPRESSION: 1. Right upper lobe pneumonia. 2. Increased pulmonary vascular congestion. Small right greater than left pleural effusions might be increased. 3. Chronic cardiomegaly. Electronically Signed   By: Odessa Fleming M.D.   On: 09/28/2015 08:32    EKG:   Orders placed or performed during the hospital encounter of 09/16/2015  . ED EKG  . ED EKG  . EKG 12-Lead  . EKG 12-Lead    ASSESSMENT AND PLAN:   Sepsis with Right side PNA (HAP).   continue Levaquin, Zosyn and vancomycin. Follow-up CBC and cultures.  Acute respiratory failure with Hypoxia.  Continue oxygen by nasal cannula. Nebulizer when necessary.  UTI. Continue antibiotics as mentioned above. Follow-up urine culture (GNR).  ARF. Continue gentle rehydration and follow-up BMP. Hyperkalemia. Improved after Kayexalate treatment. Hypernatremia. Change to half-normal saline IV and follow-up BMP.  Elevated troponin, due to demanding ischemia.  continue aspirin, Lipitor. No cardiac workup per cardiology consult. Lactic acidosis. Follow-up  lactic acid.  Chronic systolic CHF 20-25%. Stable. Hold Lasix at this time. Watch for any fluid overload.  CAD.   continue  aspirin, Lipitor. No cardiac  workup per cardiology consult. Hold Toprol due to low blood pressure.  Aortic stenosis  The patient choked while eating per nurse. Keep nothing by mouth and get a swallowing study.  All the records are reviewed and case discussed with Care Management/Social Workerr. Management plans discussed with the patient's husband and son,  and they are in agreement. Greater than 50% time was spent on coordination of care and face-to-face counseling.  CODE STATUS: DO NOT RESUSCITATE  TOTAL TIME TAKING CARE OF THIS PATIENT: 53 minutes.   POSSIBLE D/C IN >3 DAYS, DEPENDING ON CLINICAL CONDITION.   Shaune Pollack M.D on 09/15/2015 at 3:12 PM  Between 7am to 6pm - Pager - [phone]  After 6pm go to [email or web address] - password [email or web address] [facility]  [place] [facility]  Office  [phone]  CC: Primary care physician; Tillman Abide, MD

## 2015-09-15 NOTE — Care Management (Addendum)
Patient has been evaluated by SLP and patient is npo.  Her responsiveness has been decreased today.  When husband presents, it is apparent that patient is aware of his presence. Informed that patient "does not talk much."  Patient's respirations sound wet on expiration which is different from earlier in the day.  Updated primary nurse.  Husband is fearful that patient is in pain and is tearful. Spoke with son Rocky Link (just arrived from [place]) about maybe introducing comfort measure plan of care discussion with his dad should patient decline even more.  patient does have a dnr.   She has advanced dementia, aortic stenosis,  chronic heart failure with EF 20-25% pneumonia/sepsis.  Her prognosis is guarded.  Have asked for chaplain support for patient's husband and son.

## 2015-09-15 NOTE — Progress Notes (Signed)
   09/15/15 1740  Clinical Encounter Type  Visited With Patient and family together  Visit Type Initial;Spiritual support  Referral From Nurse  Consult/Referral To Chaplain  Spiritual Encounters  Spiritual Needs Prayer;Emotional  Visited with Pt and family. Provided emotional/spiritual support and prayer. Konrad Penta [phone]

## 2015-09-15 NOTE — Evaluation (Signed)
Clinical/Bedside Swallow Evaluation Patient Details  Name: [name] MRN: [id number] Date of Birth: 06-19-1931  Today's Date: 09/15/2015 Time: SLP Start Time (ACUTE ONLY): 1350 SLP Stop Time (ACUTE ONLY): 1435 SLP Time Calculation (min) (ACUTE ONLY): 45 min  Past Medical History:  Past Medical History  Diagnosis Date  . Critical aortic valve stenosis     a. Relatively asymptomatic until 12/2013 - not a candidate for TAVR 2/2 dementia.  Marland Kitchen CAD (coronary artery disease)     a. s/p CABG 03/1997 at [facility] -  including LIMA to LAD, LRA to OM, SVG to RCA with open vein harvest left thigh.  . HTN (hypertension)   . Hyperlipidemia   . SVT (supraventricular tachycardia) (HCC)     a. s/p ablation [facility].  . Polio     childhood  . Urinary incontinence   . Fecal impaction (HCC) 12/24/2013  . History of UTI 12/24/2013    >100,000 col/mL Hafnia alvei  . Heart murmur   . History of blood transfusion 1998    "pretty sure she had to have blood when she had bypass surgery  . DJD (degenerative joint disease)   . Osteoarthritis   . Arthritis     "pretty bad"  . Depression     "related to son on combat duty in [place]"   . Moderate dementia     "at times it's pretty bad" (12/31/2013)  . Ischemic cardiomyopathy     a. 12/2013 Echo: EF 20-25%, diff HK, mild AI/Sev AS, mild MR, mod TR.  Marland Kitchen Chronic systolic CHF (congestive heart failure) (HCC)     a. 12/2013 Echo: EF 20-25%   Past Surgical History:  Past Surgical History  Procedure Laterality Date  . Abdominal hysterectomy    . Bunionectomy    . Ganglion cyst excision    . Cholecystectomy    . Coronary artery bypass graft  04/02/1997    CABG x3 at [facility] using LIMA to LAD, LRA to OM, SVG to RCA with open vein harvest left thigh  . Total knee arthroplasty Left   . Total knee arthroplasty Right   . Atrial tach ablation      [facility]  . Ankle arthroplasty Right ~ 19138    "polio"  . Tonsillectomy    . Carpal tunnel release Bilateral     "I think she's had  both sides" (12/31/2013)  . Cataract extraction w/ intraocular lens  implant, bilateral Bilateral   . Cardiac catheterization      "more than 1"   . Coronary angioplasty    . Cardiac catheterization  12/2013    [facility]   HPI:  Pt is a 81 y.o. female with a known history of Moderate Dementia, CAD, chronic systolic heart failure and critical aortic stenosis and multiple other medical issues. She resides at [facility]. Pt was sent to ED fromSNF due to generalized weakness; was unable to give history d/t Dementia. She was found hypoxic at 84% and is put on Oxygen 2 L. Chest x-ray show right lower lobe pneumonia and pulmonary vascular congestion. Currently pt is NPO.   Assessment / Plan / Recommendation Clinical Impression  Pt presented with declined cognitive status and reduced awareness of the task of taking PO trials which puts her at an increased risk of aspiration. Pt has a baseline Dementia per chart and responded only minimally w/ 2-3 head nods when asked if she wanted a bolus. Pt demonstrated no immediate overt signs of oral pharyngeal dysphagia, however, w/ the declined cognitive  status, risk for aspiration is high. Maximal cues necessary during po trials but pt was able to clear the bolus bites of ice cream. ST recommends continued po trials to assess for initiation of appropriate diet consistency next 1-2 days. Information given to NSG on aspiration risk and rec. continued NPO status at this time.     Aspiration Risk  Moderate    Diet Recommendation NPO (w/ oral care at this time)   Medication Administration: Via alternative means Compensations:  (TBD)    Other  Recommendations Recommended Consults:  (Dietician) Oral Care Recommendations: Oral care QID;Staff/trained caregiver to provide oral care Other Recommendations:  (TBD)   Follow Up Recommendations       Frequency and Duration min 3x week  1 week   Pertinent Vitals/Pain No grimacing    SLP Swallow Goals  see care plan   Swallow  Study Prior Functional Status   resides at [facility]; unknown swallowing status baseline    General Date of Onset: 09/29/2015 Other Pertinent Information: Pt is a 81 y.o. female with a known history of Moderate Dementia, CAD, chronic systolic heart failure and critical aortic stenosis and multiple other medical issues. She resides at [facility]. Pt was sent to ED fromSNF due to generalized weakness; was unable to give history d/t Dementia. She was found hypoxic at 84% and is put on Oxygen 2 L. Chest x-ray show right lower lobe pneumonia and pulmonary vascular congestion. Currently pt is NPO. Type of Study: Bedside swallow evaluation Previous Swallow Assessment: none indicated Diet Prior to this Study:  (unknown per chart; suspect regular) Temperature Spikes Noted: Yes (100.1 spike; wbc 17.1 today) Respiratory Status: Supplemental O2 delivered via (comment) ([place] 3 liters) History of Recent Intubation: No Behavior/Cognition: Alert;Confused;Requires cueing;Doesn't follow directions Oral Cavity - Dentition: Adequate natural dentition/normal for age Self-Feeding Abilities: Total assist Patient Positioning: Upright in bed Baseline Vocal Quality:  (nonverbal) Volitional Cough: Cognitively unable to elicit Volitional Swallow: Unable to elicit    Oral/Motor/Sensory Function Overall Oral Motor/Sensory Function:  (unable to formally assess sec. to declined Cognitive status) Labial Symmetry: Within Functional Limits Lingual Symmetry: Within Functional Limits Facial Symmetry: Within Functional Limits   Ice Chips Ice chips: Impaired Presentation: Spoon (fed; 3 trials) Oral Phase Impairments: Poor awareness of bolus;Reduced labial seal Oral Phase Functional Implications: Oral holding;Prolonged oral transit Pharyngeal Phase Impairments:  (none)   Thin Liquid Thin Liquid: Not tested    Nectar Thick Nectar Thick Liquid: Not tested   Honey Thick Honey Thick Liquid: Not tested   Puree Puree: Impaired Presentation:  Spoon (fed; 3 trials) Oral Phase Impairments: Poor awareness of bolus;Reduced labial seal Oral Phase Functional Implications: Oral holding;Prolonged oral transit (munching pattern) Pharyngeal Phase Impairments:  (none noted)   Solid   GO    Solid: Not tested      Jerilynn Som, MS, CCC-SLP  Watson,Katherine 09/15/2015,2:46 PM

## 2015-09-16 DIAGNOSIS — R7989 Other specified abnormal findings of blood chemistry: Secondary | ICD-10-CM

## 2015-09-16 DIAGNOSIS — N390 Urinary tract infection, site not specified: Secondary | ICD-10-CM

## 2015-09-16 DIAGNOSIS — R0902 Hypoxemia: Secondary | ICD-10-CM

## 2015-09-16 DIAGNOSIS — N179 Acute kidney failure, unspecified: Secondary | ICD-10-CM

## 2015-09-16 DIAGNOSIS — J690 Pneumonitis due to inhalation of food and vomit: Secondary | ICD-10-CM

## 2015-09-16 DIAGNOSIS — I350 Nonrheumatic aortic (valve) stenosis: Secondary | ICD-10-CM

## 2015-09-16 LAB — CBC
HEMATOCRIT: 42.4 % (ref 35.0–47.0)
Hemoglobin: 13.7 g/dL (ref 12.0–16.0)
MCH: 31.3 pg (ref 26.0–34.0)
MCHC: 32.4 g/dL (ref 32.0–36.0)
MCV: 96.8 fL (ref 80.0–100.0)
PLATELETS: 219 10*3/uL (ref 150–440)
RBC: 4.38 MIL/uL (ref 3.80–5.20)
RDW: 16.3 % — AB (ref 11.5–14.5)
WBC: 14.8 10*3/uL — AB (ref 3.6–11.0)

## 2015-09-16 LAB — URINE CULTURE: Culture: 80000

## 2015-09-16 LAB — BASIC METABOLIC PANEL
Anion gap: 16 — ABNORMAL HIGH (ref 5–15)
BUN: 82 mg/dL — AB (ref 6–20)
CHLORIDE: 119 mmol/L — AB (ref 101–111)
CO2: 20 mmol/L — AB (ref 22–32)
CREATININE: 1.97 mg/dL — AB (ref 0.44–1.00)
Calcium: 9.9 mg/dL (ref 8.9–10.3)
GFR calc Af Amer: 26 mL/min — ABNORMAL LOW (ref >60)
GFR calc non Af Amer: 22 mL/min — ABNORMAL LOW (ref >60)
Glucose, Bld: 151 mg/dL — ABNORMAL HIGH (ref 65–99)
POTASSIUM: 3.1 mmol/L — AB (ref 3.5–5.1)
SODIUM: 155 mmol/L — AB (ref 135–145)

## 2015-09-16 LAB — LACTIC ACID, PLASMA: Lactic Acid, Venous: 3.2 mmol/L (ref 0.5–2.0)

## 2015-09-16 LAB — MAGNESIUM: Magnesium: 2.6 mg/dL — ABNORMAL HIGH (ref 1.7–2.4)

## 2015-09-16 MED ORDER — MORPHINE SULFATE (CONCENTRATE) 10 MG/0.5ML PO SOLN
10.0000 mg | ORAL | Status: DC | PRN
  Administered 2015-09-16: 10 mg via ORAL
  Filled 2015-09-16 (×2): qty 1

## 2015-09-16 MED ORDER — SODIUM BICARBONATE 8.4 % IV SOLN
Freq: Once | INTRAVENOUS | Status: DC
  Filled 2015-09-16: qty 150

## 2015-09-16 MED ORDER — POTASSIUM CHLORIDE 10 MEQ/100ML IV SOLN
10.0000 meq | INTRAVENOUS | Status: AC
  Administered 2015-09-16: 10 meq via INTRAVENOUS
  Filled 2015-09-16 (×4): qty 100

## 2015-09-16 MED ORDER — SODIUM CHLORIDE 0.9 % IV SOLN
500.0000 mg | Freq: Two times a day (BID) | INTRAVENOUS | Status: DC
  Administered 2015-09-16: 500 mg via INTRAVENOUS
  Filled 2015-09-16 (×3): qty 0.5

## 2015-09-16 MED ORDER — CITALOPRAM HYDROBROMIDE 20 MG PO TABS
20.0000 mg | ORAL_TABLET | Freq: Every day | ORAL | Status: DC
Start: 2015-09-17 — End: 2015-09-17

## 2015-09-16 MED ORDER — POTASSIUM CHLORIDE 10 MEQ/100ML IV SOLN
10.0000 meq | INTRAVENOUS | Status: AC
  Filled 2015-09-16 (×8): qty 100

## 2015-09-16 MED ORDER — MORPHINE SULFATE (PF) 2 MG/ML IV SOLN
2.0000 mg | Freq: Once | INTRAVENOUS | Status: AC
  Administered 2015-09-16: 2 mg via INTRAVENOUS
  Filled 2015-09-16: qty 1

## 2015-09-16 MED ORDER — FUROSEMIDE 10 MG/ML IJ SOLN: 20.0000 mg | Freq: Once | INTRAMUSCULAR | Status: DC

## 2015-09-16 MED ORDER — MORPHINE SULFATE (PF) 2 MG/ML IV SOLN
2.0000 mg | INTRAVENOUS | Status: DC | PRN
  Administered 2015-09-16: 2 mg via INTRAVENOUS
  Filled 2015-09-16: qty 1

## 2015-09-16 NOTE — Consult Note (Signed)
[facility]  CONSULT NOTE    Date: 09/16/2015                  Patient Name:  [name]  MRN: [id number]  DOB: Jun 08, 1931  Age / Sex: 81 y.o., female         PCP: Tillman Abide, MD                 Service Requesting Consult: Dr. Imogene Burn                 Reason for Consult: Acute Renal Failure            History of Present Illness: [name] is a 81 y.o. white  female with aortic valve stenosis, coronary artery disease, hyeprtension, hyperlipidemia, supraventricular tachycardia, dementia, congestive heart failure, who was admitted to [facility] on 09/29/2015 for UTI (lower urinary tract infection) [N39.0] Hypoxia [R09.02] Aspiration pneumonia of right upper lobe, unspecified aspiration pneumonia type (HCC) [J69.0]  Patient baseline creatinine of 0.8 from 06/05/15. On admission creatinine of 1.96 and has not changed much since then. Found to have right upper lobe pneumonia. Started on empiric antibiotics. Also with urinary tract infection.   Patient with lactic acidosis.   Nephrology consulted for hypernatremia   Medications: Outpatient medications: Prescriptions prior to admission  Medication Sig Dispense Refill Last Dose  . acetaminophen (TYLENOL) 325 MG tablet Take 650 mg by mouth as needed.   10/03/2015 at unknown  . acetaminophen (TYLENOL) 650 MG CR tablet Take 650 mg by mouth 3 (three) times daily.   09/13/2015 at 2000  . aspirin 81 MG chewable tablet Chew 81 mg by mouth daily.   09/13/2015 at 0800  . atorvastatin (LIPITOR) 20 MG tablet Take 20 mg by mouth daily.   09/13/2015 at 2000  . bisacodyl (DULCOLAX) 10 MG suppository Place 10 mg rectally as needed for moderate constipation.   prn at prn  . citalopram (CELEXA) 40 MG tablet Take 40 mg by mouth daily.   09/13/2015 at 2000  . furosemide (LASIX) 20 MG tablet Take 20 mg by mouth 2 (two) times daily. Also used and PRN for weight gain greater than 3lbs.   09/13/2015 at 1300  . Magnesium Hydroxide (MILK  OF MAGNESIA PO) Take by mouth as needed.   prn at prn  . metoprolol succinate (TOPROL-XL) 50 MG 24 hr tablet Take 1 tablet (50 mg total) by mouth daily. Take with or immediately following a meal. 90 tablet 3 09/13/2015 at 0800  . NON FORMULARY Med Pass 240 ml bid.   09/13/2015 at 1800  . oxymetazoline (AFRIN) 0.05 % nasal spray Place 2 sprays into both nostrils 2 (two) times daily as needed for congestion.   prn at prn  . potassium chloride SA (K-DUR,KLOR-CON) 20 MEQ tablet Take 20 mEq by mouth daily.   09/13/2015 at 0800  . QUEtiapine (SEROQUEL) 25 MG tablet Take 25 mg by mouth at bedtime.   09/13/2015 at 0800  . QUEtiapine (SEROQUEL) 50 MG tablet Take 50 mg by mouth 2 (two) times daily.   09/13/2015 at 2000  . ranitidine (ZANTAC) 150 MG tablet Take 150 mg by mouth as needed for heartburn.   prn at prn  . nitroGLYCERIN (NITROSTAT) 0.4 MG SL tablet Place 0.4 mg under the tongue every 5 (five) minutes as needed for chest pain.   prn at prn    Current medications: Current Facility-Administered Medications  Medication Dose Route Frequency Provider Last Rate  Last Dose  . 0.9 %  sodium chloride infusion  250 mL Intravenous PRN Shaune Pollack, MD      . aspirin chewable tablet 81 mg  81 mg Oral Daily Shaune Pollack, MD   81 mg at 09/18/2015 1300  . atorvastatin (LIPITOR) tablet 20 mg  20 mg Oral QPC supper Shaune Pollack, MD   20 mg at 10/06/2015 1706  . bisacodyl (DULCOLAX) suppository 10 mg  10 mg Rectal PRN Shaune Pollack, MD      . citalopram (CELEXA) tablet 40 mg  40 mg Oral Daily Shaune Pollack, MD   40 mg at 09/21/2015 1300  . famotidine (PEPCID) tablet 20 mg  20 mg Oral Daily Shaune Pollack, MD   20 mg at 09/10/2015 1300  . furosemide (LASIX) injection 20 mg  20 mg Intravenous Once Shaune Pollack, MD      . heparin injection 5,000 Units  5,000 Units Subcutaneous 3 times per day Shaune Pollack, MD   5,000 Units at 09/16/15 0600  . levalbuterol (XOPENEX) nebulizer solution 1.25 mg  1.25 mg Nebulization 4 times per day Shaune Pollack, MD   1.25 mg at  09/16/15 0810  . Levofloxacin (LEVAQUIN) IVPB 250 mg  250 mg Intravenous Once [employer], RPH   250 mg at 09/25/2015 1421  . levofloxacin (LEVAQUIN) IVPB 500 mg  500 mg Intravenous Q48H Crystal G Scarpena, RPH      . magnesium hydroxide (MILK OF MAGNESIA) suspension 30 mL  30 mL Oral Daily PRN Shaune Pollack, MD      . morphine 2 MG/ML injection 2 mg  2 mg Intravenous Q4H PRN Shaune Pollack, MD   2 mg at 09/16/15 0902  . nitroGLYCERIN (NITROSTAT) SL tablet 0.4 mg  0.4 mg Sublingual Q5 min PRN Shaune Pollack, MD      . ondansetron [facility]) injection 4 mg  4 mg Intravenous Q6H PRN Shaune Pollack, MD      . oxymetazoline (AFRIN) 0.05 % nasal spray 2 spray  2 spray Each Nare BID PRN Shaune Pollack, MD      . potassium chloride 10 mEq in 100 mL IVPB  10 mEq Intravenous Q1 Hr x 4 Shaune Pollack, MD      . QUEtiapine (SEROQUEL) tablet 25 mg  25 mg Oral QHS Shaune Pollack, MD   25 mg at 10/03/2015 2200  . QUEtiapine (SEROQUEL) tablet 50 mg  50 mg Oral BID WC Shaune Pollack, MD   50 mg at 09/21/2015 1707  . sodium chloride 0.9 % injection 3 mL  3 mL Intravenous Q12H Shaune Pollack, MD   3 mL at 09/15/15 2117  . sodium chloride 0.9 % injection 3 mL  3 mL Intravenous PRN Shaune Pollack, MD      . vancomycin (VANCOCIN) IVPB 750 mg/150 ml premix  750 mg Intravenous Q48H Crystal G Scarpena, RPH   750 mg at 09/15/15 1234      Allergies: Allergies  Allergen Reactions  . Penicillins Other (See Comments)    Unknown  . Isosorbide Rash      Past Medical History: Past Medical History  Diagnosis Date  . Critical aortic valve stenosis     a. Relatively asymptomatic until 12/2013 - not a candidate for TAVR 2/2 dementia.  Marland Kitchen CAD (coronary artery disease)     a. s/p CABG 03/1997 at [facility] -  including LIMA to LAD, LRA to OM, SVG to RCA with open vein harvest left thigh.  . HTN (hypertension)   . Hyperlipidemia   .  SVT (supraventricular tachycardia) (HCC)     a. s/p ablation [facility].  . Polio     childhood  . Urinary incontinence   . Fecal impaction (HCC)  12/24/2013  . History of UTI 12/24/2013    >100,000 col/mL Hafnia alvei  . Heart murmur   . History of blood transfusion 1998    "pretty sure she had to have blood when she had bypass surgery  . DJD (degenerative joint disease)   . Osteoarthritis   . Arthritis     "pretty bad"  . Depression     "related to son on combat duty in [place]"   . Moderate dementia     "at times it's pretty bad" (12/31/2013)  . Ischemic cardiomyopathy     a. 12/2013 Echo: EF 20-25%, diff HK, mild AI/Sev AS, mild MR, mod TR.  Marland Kitchen Chronic systolic CHF (congestive heart failure) (HCC)     a. 12/2013 Echo: EF 20-25%     Past Surgical History: Past Surgical History  Procedure Laterality Date  . Abdominal hysterectomy    . Bunionectomy    . Ganglion cyst excision    . Cholecystectomy    . Coronary artery bypass graft  04/02/1997    CABG x3 at [facility] using LIMA to LAD, LRA to OM, SVG to RCA with open vein harvest left thigh  . Total knee arthroplasty Left   . Total knee arthroplasty Right   . Atrial tach ablation      [facility]  . Ankle arthroplasty Right ~ 19138    "polio"  . Tonsillectomy    . Carpal tunnel release Bilateral     "I think she's had both sides" (12/31/2013)  . Cataract extraction w/ intraocular lens  implant, bilateral Bilateral   . Cardiac catheterization      "more than 1"   . Coronary angioplasty    . Cardiac catheterization  12/2013    [facility]     Family History: Family History  Problem Relation Age of Onset  . Heart attack Father   . Heart attack Brother   . Heart attack Mother      Social History: Social History   Social History  . Marital Status: Married    Spouse Name: N/A  . Number of Children: N/A  . Years of Education: N/A   Occupational History  . Retired    Social History Main Topics  . Smoking status: Never Smoker   . Smokeless tobacco: Never Used  . Alcohol Use: No  . Drug Use: No  . Sexual Activity: No   Other Topics Concern  . Not on file   Social History  Narrative     Review of Systems: Review of Systems  Unable to perform ROS: dementia    Vital Signs: Blood pressure 112/76, pulse 109, temperature 97.4 F (36.3 C), temperature source Axillary, resp. rate 24, height 5' (1.524 m), weight 49.17 kg (108 lb 6.4 oz), SpO2 93 %.  Weight trends: Filed Weights   09/21/2015 0830 21-Sep-2015 1145  Weight: 58.514 kg (129 lb) 49.17 kg (108 lb 6.4 oz)    Physical Exam: General: NAD, somnulent  Head: Normocephalic, atraumatic.   Eyes: Eyes closed  Neck: Supple, trachea midline  Lungs:  Clear to auscultation  Heart: Tachycardia +murmur  Abdomen:  Soft, nontender, +bowel sound  Extremities: No peripheral edema.  Neurologic: Somnulent, moving all four extremities  Skin: No lesions        Lab results: Basic Metabolic Panel:  Recent Labs Lab 09/21/15  1314 09/15/15 0455 09/16/15 0411  NA 149* 153* 155*  K 4.1 3.6 3.1*  CL 117* 118* 119*  CO2 22 20* 20*  GLUCOSE 159* 128* 151*  BUN 69* 73* 82*  CREATININE 1.95* 1.74* 1.97*  CALCIUM 10.1 9.6 9.9  MG  --   --  2.6*    Liver Function Tests: No results for input(s): AST, ALT, ALKPHOS, BILITOT, PROT, ALBUMIN in the last 168 hours. No results for input(s): LIPASE, AMYLASE in the last 168 hours. No results for input(s): AMMONIA in the last 168 hours.  CBC:  Recent Labs Lab 03-Oct-2015 0807 09/15/15 0455 09/16/15 0411  WBC 14.3* 17.7* 14.8*  NEUTROABS 12.9*  --   --   HGB 14.8 13.1 13.7  HCT 46.5 41.1 42.4  MCV 95.8 95.7 96.8  PLT 217 214 219    Cardiac Enzymes:  Recent Labs Lab October 03, 2015 0807 10-03-15 1314 Oct 03, 2015 1817 09/15/15  TROPONINI 0.58* 0.59* 0.72* 0.65*    BNP: Invalid input(s): POCBNP  CBG: No results for input(s): GLUCAP in the last 168 hours.  Microbiology: Results for orders placed or performed during the hospital encounter of 2015/10/03  Urine culture     Status: None (Preliminary result)   Collection Time: October 03, 2015  8:07 AM  Result Value Ref Range  Status   Specimen Description URINE, RANDOM  Final   Special Requests NONE  Final   Culture   Final    80,000 COLONIES/ml GRAM NEGATIVE RODS IDENTIFICATION AND SUSCEPTIBILITIES TO FOLLOW    Report Status PENDING  Incomplete  MRSA PCR Screening     Status: None   Collection Time: October 03, 2015 12:45 PM  Result Value Ref Range Status   MRSA by PCR NEGATIVE NEGATIVE Final    Comment:        The GeneXpert MRSA Assay (FDA approved for NASAL specimens only), is one component of a comprehensive MRSA colonization surveillance program. It is not intended to diagnose MRSA infection nor to guide or monitor treatment for MRSA infections.     Coagulation Studies: No results for input(s): LABPROT, INR in the last 72 hours.  Urinalysis:  Recent Labs  10-03-15 0807  COLORURINE YELLOW*  LABSPEC 1.023  PHURINE 5.0  GLUCOSEU NEGATIVE  HGBUR NEGATIVE  BILIRUBINUR NEGATIVE  KETONESUR NEGATIVE  PROTEINUR 30*  NITRITE NEGATIVE  LEUKOCYTESUR 3+*      Imaging: Dg Chest Port 1 View  09/15/2015  CLINICAL DATA:  Pneumonia. Short of breath. History of the TIA and CHF as well as cardiomyopathy. EXAM: PORTABLE CHEST 1 VIEW COMPARISON:  10-03-15 FINDINGS: Right upper lobe consolidation has worsened, become more confluent. There is also airspace opacity in both lung bases which may reflect infection, atelectasis or a combination. There is no convincing pulmonary edema. Cardiac silhouette is mildly enlarged. Changes from previous CABG surgery are stable. No convincing mediastinal or hilar masses. No pneumothorax. IMPRESSION: 1. Worsened lung aeration. There is more confluent airspace opacity in right upper lobe and in both lung bases. Findings support multifocal pneumonia. Electronically Signed   By: Amie Portland M.D.   On: 09/15/2015 18:29      Assessment & Plan: [name] is a 81 y.o. white  female with aortic valve stenosis, coronary artery disease, hyeprtension, hyperlipidemia,  supraventricular tachycardia, dementia, congestive heart failure, who was admitted to [facility] on 10-03-2015 for acute renal failure, hypernatremia, metabolic acidosis and urinary tract infection.   1. Acute Renal Failure with metabolic acidosis: secondary to concurrent illness. Aortic stenosis, pneumonia and overdiuresis.  -  will start gentle IV fluids bicarb gtt at 9mL/hr.  - follow renal function panel, urine output, serum electrolytes. Not a good candidate for chronic dialysis. No acute indication for dialysis.  - hold furosemide  2. Hypernatremia: secondary to free water deficit.  - start D5 in bicarb gtt.   3. Urinary tract infection: cultures gram negative rods. On levofloxacin for both pneumonia and UTI.   Overall prognosis is poor.   LOS: 2 Damaris Abeln 11/10/20169:41 AM

## 2015-09-16 NOTE — Progress Notes (Signed)
   SUBJECTIVE:  The patient is not responsive and she is tachypneic.   Filed Vitals:   09/16/15 1941 09/16/15 0751 09/16/15 0810 09/16/15 0810  BP: 111/66 189/131  112/76  Pulse: 94 103  109  Temp: 99.4 F (37.4 C)   97.4 F (36.3 C)  TempSrc: Oral   Axillary  Resp: 24 20  24   Height:      Weight:      SpO2: 94% 93% 94% 93%    Intake/Output Summary (Last 24 hours) at 09/16/15 0905 Last data filed at 09/15/15 0948  Gross per 24 hour  Intake      0 ml  Output      0 ml  Net      0 ml    LABS: Basic Metabolic Panel:  Recent Labs  74/08/14 0455 09/16/15 0411  NA 153* 155*  K 3.6 3.1*  CL 118* 119*  CO2 20* 20*  GLUCOSE 128* 151*  BUN 73* 82*  CREATININE 1.74* 1.97*  CALCIUM 9.6 9.9  MG  --  2.6*   Liver Function Tests: No results for input(s): AST, ALT, ALKPHOS, BILITOT, PROT, ALBUMIN in the last 72 hours. No results for input(s): LIPASE, AMYLASE in the last 72 hours. CBC:  Recent Labs  09/22/2015 0807 09/15/15 0455 09/16/15 0411  WBC 14.3* 17.7* 14.8*  NEUTROABS 12.9*  --   --   HGB 14.8 13.1 13.7  HCT 46.5 41.1 42.4  MCV 95.8 95.7 96.8  PLT 217 214 219   Cardiac Enzymes:  Recent Labs  09/19/2015 1314 10/03/2015 1817 09/15/15  TROPONINI 0.59* 0.72* 0.65*   BNP: Invalid input(s): POCBNP D-Dimer: No results for input(s): DDIMER in the last 72 hours. Hemoglobin A1C: No results for input(s): HGBA1C in the last 72 hours. Fasting Lipid Panel: No results for input(s): CHOL, HDL, LDLCALC, TRIG, CHOLHDL, LDLDIRECT in the last 72 hours. Thyroid Function Tests: No results for input(s): TSH, T4TOTAL, T3FREE, THYROIDAB in the last 72 hours.  Invalid input(s): FREET3 Anemia Panel: No results for input(s): VITAMINB12, FOLATE, FERRITIN, TIBC, IRON, RETICCTPCT in the last 72 hours.   PHYSICAL EXAM General: Critically ill appearing HEENT:  Normocephalic and atramatic Neck:  No JVD.  Lungs: Tachypneic with diminished breath sounds. Heart: Tachycardic with  a 2/6 systolic ejection murmur in the aortic area Abdomen: Bowel sounds are positive, abdomen soft and non-tender  Msk:  Back normal, normal gait. Normal strength and tone for age. Extremities: No clubbing, cyanosis or edema.   Neuro: Not responsive Psych:  Not able to evaluate  TELEMETRY: Reviewed telemetry pt in sinus tachycardia   ASSESSMENT AND PLAN:  1) Elevated troponin Likely due to supply demand ischemia with with underlying critical aortic stenosis. The patient appears to be terminally ill and there is no definite treatment for cure.  I recommend comfort care.   2) critical aortic valve stenosis Not a candidate for TAVR due to advanced dementia.  3) CAD, CABG: Prior cardiac catheterization 2/16 with patent grafts  Lorine Bears, MD, [facility] 09/16/2015 9:05 AM

## 2015-09-16 NOTE — Progress Notes (Addendum)
CSW discussed case with Dr. Imogene Burn regarding MD for recommendation of comfort care versus continued aggressive care. CSW followed up with [facility] where Pt is long term care resident to discuss options of hospice at SNF if that is choice of family and Pt is medically stable enough for dc at end of hospitalization. [facility] states that Pt CAN return with hospice following for comfort care measures. CSW also discussed case with [facility] Liaison regarding possibility of hospice home. CSW discussed with Pt's sons options so that they could discuss with their father later today when he arrives. Pt's son state that they want to focus on their mother's comfort at this time but that they are worried about their father. CSW advised that comfort measures not only impacts the medications, but also care orders such as checking blood sugars or pressures etc. Pt's sons verbalized understanding and will share the information with their father. Pt's husband will not be at hospital until later this evening. CSW will continue to follow up with Pt's family and her husband to support and offer discharge options focused on comfort of patient.   Wilford Grist, LCSW [phone]

## 2015-09-16 NOTE — Progress Notes (Addendum)
ANTIBIOTIC CONSULT NOTE - INITIAL  Pharmacy Consult for Meropenem Indication: ESBL UTI, HCAP  Allergies  Allergen Reactions  . Penicillins Other (See Comments)    Unknown  . Isosorbide Rash    Patient Measurements: Height: 5' (152.4 cm) Weight: 108 lb 6.4 oz (49.17 kg) (verified by tanya) IBW/kg (Calculated) : 45.5  Vital Signs: Temp: 98.1 F (36.7 C) (11/10 1141) Temp Source: Oral (11/10 1141) BP: 126/61 mmHg (11/10 1141) Pulse Rate: 122 (11/10 1141) Intake/Output from previous day:   Intake/Output from this shift:    Labs:  Recent Labs  09/24/2015 0807 10/02/2015 1314 09/15/15 0455 09/16/15 0411  WBC 14.3*  --  17.7* 14.8*  HGB 14.8  --  13.1 13.7  PLT 217  --  214 219  CREATININE 1.96* 1.95* 1.74* 1.97*   Estimated Creatinine Clearance: 15.8 mL/min (by C-G formula based on Cr of 1.97). No results for input(s): VANCOTROUGH, VANCOPEAK, VANCORANDOM, GENTTROUGH, GENTPEAK, GENTRANDOM, TOBRATROUGH, TOBRAPEAK, TOBRARND, AMIKACINPEAK, AMIKACINTROU, AMIKACIN in the last 72 hours.   Microbiology: Recent Results (from the past 720 hour(s))  Urine culture     Status: None   Collection Time: 09/08/2015  8:07 AM  Result Value Ref Range Status   Specimen Description URINE, RANDOM  Final   Special Requests NONE  Final   Culture   Final    80,000 COLONIES/ml ESCHERICHIA COLI Results Called to: [facility] TODD AT 1152 09/16/15 DV ESBL-EXTENDED SPECTRUM BETA LACTAMASE-THE ORGANISM IS RESISTANT TO PENICILLINS, CEPHALOSPORINS AND AZTREONAM ACCORDING TO CLSI M100-S15 VOL.25 N01 JAN 2005.    Report Status 09/16/2015 FINAL  Final   Organism ID, Bacteria ESCHERICHIA COLI  Final      Susceptibility   Escherichia coli - MIC*    AMPICILLIN >=32 RESISTANT Resistant     CEFTAZIDIME 16 RESISTANT Resistant     CEFAZOLIN >=64 RESISTANT Resistant     CEFTRIAXONE >=64 RESISTANT Resistant     CIPROFLOXACIN >=4 RESISTANT Resistant     GENTAMICIN >=16 RESISTANT Resistant     IMIPENEM <=0.25  SENSITIVE Sensitive     TRIMETH/SULFA <=20 SENSITIVE Sensitive     Extended ESBL POSITIVE Resistant     PIP/TAZO Value in next row Sensitive      SENSITIVE8    * 80,000 COLONIES/ml ESCHERICHIA COLI  MRSA PCR Screening     Status: None   Collection Time: 09/24/2015 12:45 PM  Result Value Ref Range Status   MRSA by PCR NEGATIVE NEGATIVE Final    Comment:        The GeneXpert MRSA Assay (FDA approved for NASAL specimens only), is one component of a comprehensive MRSA colonization surveillance program. It is not intended to diagnose MRSA infection nor to guide or monitor treatment for MRSA infections.     Medical History: Past Medical History  Diagnosis Date  . Critical aortic valve stenosis     a. Relatively asymptomatic until 12/2013 - not a candidate for TAVR 2/2 dementia.  Marland Kitchen CAD (coronary artery disease)     a. s/p CABG 03/1997 at [facility] -  including LIMA to LAD, LRA to OM, SVG to RCA with open vein harvest left thigh.  . HTN (hypertension)   . Hyperlipidemia   . SVT (supraventricular tachycardia) (HCC)     a. s/p ablation [facility].  . Polio     childhood  . Urinary incontinence   . Fecal impaction (HCC) 12/24/2013  . History of UTI 12/24/2013    >100,000 col/mL Hafnia alvei  . Heart murmur   . History  of blood transfusion 1998    "pretty sure she had to have blood when she had bypass surgery  . DJD (degenerative joint disease)   . Osteoarthritis   . Arthritis     "pretty bad"  . Depression     "related to son on combat duty in [place]"   . Moderate dementia     "at times it's pretty bad" (12/31/2013)  . Ischemic cardiomyopathy     a. 12/2013 Echo: EF 20-25%, diff HK, mild AI/Sev AS, mild MR, mod TR.  Marland Kitchen Chronic systolic CHF (congestive heart failure) (HCC)     a. 12/2013 Echo: EF 20-25%     Assessment: 81 yo female admitted for treatment of HCAP and UTI. UCx growing ESBL E Coli sensitive to imipenem. Pt unable to state what her PCN allergy is, no family around, asked RN to  monitor pt closely for s/sx of allergic reaction following administration of first dose.   Goal of Therapy:  Resolution of infection  Plan:  Will order meropenem 500 mg IV q12h based on current renal function.   Pharmacy will continue to follow.   Crist Fat L 09/16/2015,1:06 PM

## 2015-09-16 NOTE — Progress Notes (Signed)
Requested by CSW Delice Bison to speak with patient's sons regarding hospice services at [facility] as well as hospice services at the [facility]. Services explained. Questions answered, they appeared to be wanting Mrs. Zhou to return to [facility] with hospice services. They expressed the need to speak with their father, patient's husband Dorene Sorrow, first and foremost, about accepting comfort care and then about where he would like the services to take place. Family to advise CSW or CM in the morning and liaison to be notified of family choice. Thank you. Dayna Barker RN, BSN, [facility] [place], [facility] [phone] c

## 2015-09-16 NOTE — Progress Notes (Addendum)
[facility] [place] at [facility]   PATIENT NAME: Zaia Carre    MR#:  [id number]  DATE OF BIRTH:  1931/09/24  SUBJECTIVE:  CHIEF COMPLAINT:   Chief Complaint  Patient presents with  . Weakness   the patient is demented and noncommunicative. On O2 [place] 4 L.  REVIEW OF SYSTEMS:  Unable to obtain  DRUG ALLERGIES:   Allergies  Allergen Reactions  . Penicillins Other (See Comments)    Unknown  . Isosorbide Rash    VITALS:  Blood pressure 126/61, pulse 122, temperature 98.1 F (36.7 C), temperature source Oral, resp. rate 30, height 5' (1.524 m), weight 49.17 kg (108 lb 6.4 oz), SpO2 95 %.  PHYSICAL EXAMINATION:  GENERAL:  81 y.o.-year-old patient lying in the bed with respiratory distress.  EYES: Pupils equal, round, reactive to light and accommodation. No scleral icterus.  HEENT: Head atraumatic, normocephalic. Moist oral mucosa.  NECK:  Supple, no jugular venous distention. No thyroid enlargement, no tenderness.  LUNGS: Coarse breath sounds bilaterally, wheezing, bilateral crackles. Mild use of accessory muscles of respiration.  CARDIOVASCULAR: S1, S2 normal. No murmurs, rubs, or gallops.  ABDOMEN: Soft, nontender, nondistended. Bowel sounds present. No organomegaly or mass.  EXTREMITIES: No pedal edema, cyanosis, or clubbing.  NEUROLOGIC: Does not follow commands, unable to exam.  PSYCHIATRIC: The patient is demented.  SKIN: No obvious rash, lesion, or ulcer.    LABORATORY PANEL:   CBC  Recent Labs Lab 09/16/15 0411  WBC 14.8*  HGB 13.7  HCT 42.4  PLT 219   ------------------------------------------------------------------------------------------------------------------  Chemistries   Recent Labs Lab 09/16/15 0411  NA 155*  K 3.1*  CL 119*  CO2 20*  GLUCOSE 151*  BUN 82*  CREATININE 1.97*  CALCIUM 9.9  MG 2.6*    ------------------------------------------------------------------------------------------------------------------  Cardiac Enzymes  Recent Labs Lab 09/15/15  TROPONINI 0.65*   ------------------------------------------------------------------------------------------------------------------  RADIOLOGY:  Dg Chest Port 1 View  09/15/2015  CLINICAL DATA:  Pneumonia. Short of breath. History of the TIA and CHF as well as cardiomyopathy. EXAM: PORTABLE CHEST 1 VIEW COMPARISON:  13-Oct-2015 FINDINGS: Right upper lobe consolidation has worsened, become more confluent. There is also airspace opacity in both lung bases which may reflect infection, atelectasis or a combination. There is no convincing pulmonary edema. Cardiac silhouette is mildly enlarged. Changes from previous CABG surgery are stable. No convincing mediastinal or hilar masses. No pneumothorax. IMPRESSION: 1. Worsened lung aeration. There is more confluent airspace opacity in right upper lobe and in both lung bases. Findings support multifocal pneumonia. Electronically Signed   By: Amie Portland M.D.   On: 09/15/2015 18:29    EKG:   Orders placed or performed during the hospital encounter of 10/13/15  . ED EKG  . ED EKG  . EKG 12-Lead  . EKG 12-Lead    ASSESSMENT AND PLAN:   Sepsis with Right side PNA (HAP).   continue Levaquin, start meropenem PTD and discontinue vancomycin. Follow-up CBC.  Acute respiratory failure with Hypoxia.  Continue oxygen by nasal cannula. Nebulizer when necessary.   UTI. Start meropenem. Follow-up urine culture (ESBL).  ARF. Worsening. Continue gentle rehydration and follow-up BMP. Hyperkalemia. Improved after Kayexalate treatment. Hypernatremia. start D5 in bicarb gtt per Dr.Kolluru. Hypokalemia. KCl supplement. F/u BMP.  Elevated troponin, due to demanding ischemia.  continue aspirin, Lipitor. No cardiac workup per cardiology consult. Comfort care per Dr. Kirke Corin. Lactic acidosis. Worsening.   Follow-up  lactic acid.  Chronic systolic CHF 20-25%. Stable. Hold Lasix at  this time. Watch for any fluid overload.  CAD.   continue aspirin, Lipitor. No cardiac workup per cardiology consult. Hold Toprol due to low blood pressure.  Aortic stenosis  The patient choked while eating per nurse. Keep nothing by mouth and follow up swallowing study.  Very poor prognosis. Hospice screen for hospice care.   Discussed with Dr. Wynelle Link and Dr. Dema Severin. Dr. Dema Severin suggested comfort care.  I discussed her 2 sons about the patient's critical conditio, very poor prognosis and possible comfort care. They will discussed with patient's husband (POA).   All the records are reviewed and case discussed with Care Management/Social Worker. Management plans discussed with the patient's 2 sons,  and they are in agreement. Greater than 50% time was spent on coordination of care and face-to-face counseling.  CODE STATUS: DO NOT RESUSCITATE  TOTAL CRITICAL TIME TAKING CARE OF THIS PATIENT: 62  minutes.   POSSIBLE D/C IN >3 DAYS, DEPENDING ON CLINICAL CONDITION.   Shaune Pollack M.D on 09/16/2015 at 1:29 PM  Between 7am to 6pm - Pager - [phone]  After 6pm go to [email or web address] - password [email or web address] [facility]  [facility]  Office  [phone]  CC: Primary care physician; Tillman Abide, MD

## 2015-09-16 NOTE — Progress Notes (Signed)
MD notified. Pts respirations are 30-50 breaths per minute. Orders for morphine received. Will continue to assess.

## 2015-09-16 NOTE — Care Management (Signed)
Patient is tachypneic and elevated heart rate.  Her chest xray shows worsening.  IVFs were stopped last pm.   Renal status is declining.  There had been discussion of comfort care last pm with patient's husband and son Rocky Link. Patient has been restless and reecived IV morphine and informed that this dose was increased.  Discussed the use of Roxanol with attending rather than intravenous.  Discussed possible hospice care either at [facility].  Husband lives on [place] [facility] so at present think return to [place] under hospice care may be more appropriate. Discussed calling family in before the afternoon when they planned to visit. Case now referred to CSW.  CSW will speak with Sue Lush at [facility] and contact Servando Snare with [facility]

## 2015-09-16 NOTE — Consult Note (Addendum)
PULMONARY / CRITICAL CARE MEDICINE   Name: [name] MRN: [id number] DOB: November 22, 1930    ADMISSION DATE:  10/01/2015 CONSULTATION DATE:  09/16/15  REFERRING MD :  Dr. Imogene Burn   CHIEF COMPLAINT:   weakness  Reason for consult: pneumonia, possible aspiration  HISTORY OF PRESENT ILLNESS    [name] is a 81 y.o. female with a known history of CAD, chronic systolic heart failure and critical aortic stenosis. She cannot give a history due to dementia. the patient was sent to ED from SNF due to generalized weakness. She was found hypoxic at 84% and is put on Oxygen 2 L. Chest x-ray show right lower lobe pneumonia. The patient was treated with Levaquin 1 dose in ED. she also has critical aortic valve stenosis, SVT, evaluated by cardiology, noted to have elevated troponin, cardiology recommended comfort care as there is no definitive treatment for her underlying critical aortic stenosis and terminally ill appearance. Concern for acute renal failure and metabolic acidosis, hyponatremia, very tract infection, aspiration pneumonia. Son at bedside, provided majority of the history, stated that she is a DO NOT RESUSCITATE, and he is currently waiting on his father to arriving in town before making her a full withdrawal of care/comfort care patient.  SIGNIFICANT EVENTS    PAST MEDICAL HISTORY    :  Past Medical History  Diagnosis Date  . Critical aortic valve stenosis     a. Relatively asymptomatic until 12/2013 - not a candidate for TAVR 2/2 dementia.  Marland Kitchen CAD (coronary artery disease)     a. s/p CABG 03/1997 at [facility] -  including LIMA to LAD, LRA to OM, SVG to RCA with open vein harvest left thigh.  . HTN (hypertension)   . Hyperlipidemia   . SVT (supraventricular tachycardia) (HCC)     a. s/p ablation [facility].  . Polio     childhood  . Urinary incontinence   . Fecal impaction (HCC) 12/24/2013  . History of UTI 12/24/2013    >100,000 col/mL Hafnia alvei  . Heart murmur   . History of blood  transfusion 1998    "pretty sure she had to have blood when she had bypass surgery  . DJD (degenerative joint disease)   . Osteoarthritis   . Arthritis     "pretty bad"  . Depression     "related to son on combat duty in [place]"   . Moderate dementia     "at times it's pretty bad" (12/31/2013)  . Ischemic cardiomyopathy     a. 12/2013 Echo: EF 20-25%, diff HK, mild AI/Sev AS, mild MR, mod TR.  Marland Kitchen Chronic systolic CHF (congestive heart failure) (HCC)     a. 12/2013 Echo: EF 20-25%   Past Surgical History  Procedure Laterality Date  . Abdominal hysterectomy    . Bunionectomy    . Ganglion cyst excision    . Cholecystectomy    . Coronary artery bypass graft  04/02/1997    CABG x3 at [facility] using LIMA to LAD, LRA to OM, SVG to RCA with open vein harvest left thigh  . Total knee arthroplasty Left   . Total knee arthroplasty Right   . Atrial tach ablation      [facility]  . Ankle arthroplasty Right ~ 19138    "polio"  . Tonsillectomy    . Carpal tunnel release Bilateral     "I think she's had both sides" (12/31/2013)  . Cataract extraction w/ intraocular lens  implant, bilateral Bilateral   . Cardiac  catheterization      "more than 1"   . Coronary angioplasty    . Cardiac catheterization  12/2013    [facility]   Prior to Admission medications   Medication Sig Start Date End Date Taking? Authorizing Provider  acetaminophen (TYLENOL) 325 MG tablet Take 650 mg by mouth as needed.   Yes Historical Provider, MD  acetaminophen (TYLENOL) 650 MG CR tablet Take 650 mg by mouth 3 (three) times daily.   Yes Historical Provider, MD  aspirin 81 MG chewable tablet Chew 81 mg by mouth daily.   Yes Historical Provider, MD  atorvastatin (LIPITOR) 20 MG tablet Take 20 mg by mouth daily.   Yes Historical Provider, MD  bisacodyl (DULCOLAX) 10 MG suppository Place 10 mg rectally as needed for moderate constipation.   Yes Historical Provider, MD  citalopram (CELEXA) 40 MG tablet Take 40 mg by mouth daily.   Yes  Historical Provider, MD  furosemide (LASIX) 20 MG tablet Take 20 mg by mouth 2 (two) times daily. Also used and PRN for weight gain greater than 3lbs.   Yes Historical Provider, MD  Magnesium Hydroxide (MILK OF MAGNESIA PO) Take by mouth as needed.   Yes Historical Provider, MD  metoprolol succinate (TOPROL-XL) 50 MG 24 hr tablet Take 1 tablet (50 mg total) by mouth daily. Take with or immediately following a meal. 07/06/14  Yes Iran Ouch, MD  NON FORMULARY Med Pass 240 ml bid.   Yes Historical Provider, MD  oxymetazoline (AFRIN) 0.05 % nasal spray Place 2 sprays into both nostrils 2 (two) times daily as needed for congestion.   Yes Historical Provider, MD  potassium chloride SA (K-DUR,KLOR-CON) 20 MEQ tablet Take 20 mEq by mouth daily.   Yes Historical Provider, MD  QUEtiapine (SEROQUEL) 25 MG tablet Take 25 mg by mouth at bedtime.   Yes Historical Provider, MD  QUEtiapine (SEROQUEL) 50 MG tablet Take 50 mg by mouth 2 (two) times daily.   Yes Historical Provider, MD  ranitidine (ZANTAC) 150 MG tablet Take 150 mg by mouth as needed for heartburn.   Yes Historical Provider, MD  nitroGLYCERIN (NITROSTAT) 0.4 MG SL tablet Place 0.4 mg under the tongue every 5 (five) minutes as needed for chest pain.    Historical Provider, MD   Allergies  Allergen Reactions  . Penicillins Other (See Comments)    Unknown  . Isosorbide Rash     FAMILY HISTORY   Family History  Problem Relation Age of Onset  . Heart attack Father   . Heart attack Brother   . Heart attack Mother       SOCIAL HISTORY    reports that she has never smoked. She has never used smokeless tobacco. She reports that she does not drink alcohol or use illicit drugs.  Review of Systems  Unable to perform ROS: dementia      VITAL SIGNS    Temp:  [97.4 F (36.3 C)-99.4 F (37.4 C)] 98.1 F (36.7 C) (11/10 1141) Pulse Rate:  [94-122] 122 (11/10 1141) Resp:  [20-30] 30 (11/10 1141) BP: (111-189)/(61-131) 126/61 mmHg  (11/10 1141) SpO2:  [93 %-97 %] 95 % (11/10 1408) HEMODYNAMICS:   VENTILATOR SETTINGS:   INTAKE / OUTPUT: No intake or output data in the 24 hours ending 09/16/15 1659     PHYSICAL EXAM   Physical Exam  Constitutional: She appears distressed.  HENT:  Head: Normocephalic.  Right Ear: External ear normal.  Left Ear: External ear normal.  Eyes: Pupils  are equal, round, and reactive to light.  Neck: Neck supple.  Cardiovascular: Intact distal pulses.   Tachycardia   Pulmonary/Chest: She is in respiratory distress. She has no wheezes. She has no rales.  Shallow rapid respirations Tachypnea Shallow breath sounds  Abdominal: Soft. She exhibits no distension.  Neurological:  Lethargic Obtunded   Skin: Skin is warm and dry.  Nursing note and vitals reviewed.      LABS   LABS:  CBC  Recent Labs Lab 09/12/2015 0807 09/15/15 0455 09/16/15 0411  WBC 14.3* 17.7* 14.8*  HGB 14.8 13.1 13.7  HCT 46.5 41.1 42.4  PLT 217 214 219   Coag's No results for input(s): APTT, INR in the last 168 hours. BMET  Recent Labs Lab 10/01/2015 1314 09/15/15 0455 09/16/15 0411  NA 149* 153* 155*  K 4.1 3.6 3.1*  CL 117* 118* 119*  CO2 22 20* 20*  BUN 69* 73* 82*  CREATININE 1.95* 1.74* 1.97*  GLUCOSE 159* 128* 151*   Electrolytes  Recent Labs Lab 09/16/2015 1314 09/15/15 0455 09/16/15 0411  CALCIUM 10.1 9.6 9.9  MG  --   --  2.6*   Sepsis Markers  Recent Labs Lab 09/19/2015 1052 09/15/15 0455 09/16/15 0411  LATICACIDVEN 2.6* 2.4* 3.2*   ABG No results for input(s): PHART, PCO2ART, PO2ART in the last 168 hours. Liver Enzymes No results for input(s): AST, ALT, ALKPHOS, BILITOT, ALBUMIN in the last 168 hours. Cardiac Enzymes  Recent Labs Lab 09/15/2015 1314 09/08/2015 1817 09/15/15  TROPONINI 0.59* 0.72* 0.65*   Glucose No results for input(s): GLUCAP in the last 168 hours.   Recent Results (from the past 240 hour(s))  Urine culture     Status: None    Collection Time: 09/15/2015  8:07 AM  Result Value Ref Range Status   Specimen Description URINE, RANDOM  Final   Special Requests NONE  Final   Culture   Final    80,000 COLONIES/ml ESCHERICHIA COLI Results Called to: [facility] TODD AT 1152 09/16/15 DV ESBL-EXTENDED SPECTRUM BETA LACTAMASE-THE ORGANISM IS RESISTANT TO PENICILLINS, CEPHALOSPORINS AND AZTREONAM ACCORDING TO CLSI M100-S15 VOL.25 N01 JAN 2005.    Report Status 09/16/2015 FINAL  Final   Organism ID, Bacteria ESCHERICHIA COLI  Final      Susceptibility   Escherichia coli - MIC*    AMPICILLIN >=32 RESISTANT Resistant     CEFTAZIDIME 16 RESISTANT Resistant     CEFAZOLIN >=64 RESISTANT Resistant     CEFTRIAXONE >=64 RESISTANT Resistant     CIPROFLOXACIN >=4 RESISTANT Resistant     GENTAMICIN >=16 RESISTANT Resistant     IMIPENEM <=0.25 SENSITIVE Sensitive     TRIMETH/SULFA <=20 SENSITIVE Sensitive     Extended ESBL POSITIVE Resistant     PIP/TAZO Value in next row Sensitive      SENSITIVE8    * 80,000 COLONIES/ml ESCHERICHIA COLI  MRSA PCR Screening     Status: None   Collection Time: 09/28/2015 12:45 PM  Result Value Ref Range Status   MRSA by PCR NEGATIVE NEGATIVE Final    Comment:        The GeneXpert MRSA Assay (FDA approved for NASAL specimens only), is one component of a comprehensive MRSA colonization surveillance program. It is not intended to diagnose MRSA infection nor to guide or monitor treatment for MRSA infections.      Current facility-administered medications:  .  0.9 %  sodium chloride infusion, 250 mL, Intravenous, PRN, Shaune Pollack, MD .  aspirin chewable tablet 81  mg, 81 mg, Oral, Daily, Shaune Pollack, MD, 81 mg at 09-28-2015 1300 .  bisacodyl (DULCOLAX) suppository 10 mg, 10 mg, Rectal, PRN, Shaune Pollack, MD .  Melene Muller ON 09/21/2015] citalopram (CELEXA) tablet 20 mg, 20 mg, Oral, Daily, Shaune Pollack, MD .  famotidine (PEPCID) tablet 20 mg, 20 mg, Oral, Daily, Shaune Pollack, MD, 20 mg at Sep 28, 2015 1300 .  heparin  injection 5,000 Units, 5,000 Units, Subcutaneous, 3 times per day, Shaune Pollack, MD, 5,000 Units at 09/16/15 1520 .  levalbuterol (XOPENEX) nebulizer solution 1.25 mg, 1.25 mg, Nebulization, 4 times per day, Shaune Pollack, MD, 1.25 mg at 09/16/15 0810 .  magnesium hydroxide (MILK OF MAGNESIA) suspension 30 mL, 30 mL, Oral, Daily PRN, Shaune Pollack, MD .  meropenem (MERREM) 500 mg in sodium chloride 0.9 % 50 mL IVPB, 500 mg, Intravenous, Q12H, Shaune Pollack, MD .  morphine 2 MG/ML injection 2 mg, 2 mg, Intravenous, Once, Lamont Dowdy, MD .  morphine CONCENTRATE 10 MG/0.5ML oral solution 10 mg, 10 mg, Oral, Q4H PRN, Shaune Pollack, MD .  nitroGLYCERIN (NITROSTAT) SL tablet 0.4 mg, 0.4 mg, Sublingual, Q5 min PRN, Shaune Pollack, MD .  ondansetron [facility]) injection 4 mg, 4 mg, Intravenous, Q6H PRN, Shaune Pollack, MD .  oxymetazoline (AFRIN) 0.05 % nasal spray 2 spray, 2 spray, Each Nare, BID PRN, Shaune Pollack, MD .  QUEtiapine (SEROQUEL) tablet 25 mg, 25 mg, Oral, QHS, Shaune Pollack, MD, 25 mg at Sep 28, 2015 2200 .  QUEtiapine (SEROQUEL) tablet 50 mg, 50 mg, Oral, BID WC, Shaune Pollack, MD, 50 mg at 09-28-2015 1707 .  sodium bicarbonate 150 mEq in dextrose 5 % 1,000 mL infusion, , Intravenous, Once, Sarath Kolluru, MD .  sodium chloride 0.9 % injection 3 mL, 3 mL, Intravenous, Q12H, Shaune Pollack, MD, 3 mL at 09/15/15 2117 .  sodium chloride 0.9 % injection 3 mL, 3 mL, Intravenous, PRN, Shaune Pollack, MD  IMAGING    Dg Chest Port 1 View  09/15/2015  CLINICAL DATA:  Pneumonia. Short of breath. History of the TIA and CHF as well as cardiomyopathy. EXAM: PORTABLE CHEST 1 VIEW COMPARISON:  09/28/15 FINDINGS: Right upper lobe consolidation has worsened, become more confluent. There is also airspace opacity in both lung bases which may reflect infection, atelectasis or a combination. There is no convincing pulmonary edema. Cardiac silhouette is mildly enlarged. Changes from previous CABG surgery are stable. No convincing mediastinal or hilar masses. No  pneumothorax. IMPRESSION: 1. Worsened lung aeration. There is more confluent airspace opacity in right upper lobe and in both lung bases. Findings support multifocal pneumonia. Electronically Signed   By: Amie Portland M.D.   On: 09/15/2015 18:29    ASSESSMENT/PLAN  81 year old female with systolic heart failure, critical aortic stenosis, dementia, admitted for possible pneumonia and UTI, is a DO NOT RESUSCITATE. Pulmonary consult for possible worsening pneumonia along with aspiration.  Acute respiratory failure,  critical aortic stenosis,  UTI, possible aspiration pneumonia,  acute renal failure,  hypoxia  Dementia  -On my examination patient is fully obtunded, does not respond to sternal rub, I believe that she is critically ill with the above medical conditions, given her complex medical history especially with underlying critical aortic stenosis and now with elevated troponin, any meaningful recovery is very low. -There is no further intervention from a pulmonary standpoint that will provide any meaningful recovery. -Agree with primary care physician and other consultants, that given her terminal state, appropriate recommendation would be comfort care/palliative care measures.   I have  personally obtained a history, examined the patient, evaluated laboratory and imaging results, formulated the assessment and plan and placed orders.  Pulmonary consult time - .   Overall, patient is critically ill, prognosis is guarded. Patient at high risk for cardiac arrest and death.   Stephanie Acre, MD [facility] Pager [phone] (please enter 7-digits) On Call Pager - [phone] (please enter 7-digits)     09/16/2015, 4:59 PM  Note: This note was prepared with Dragon dictation along with smaller phrase technology. Any transcriptional errors that result from this process are unintentional.

## 2015-09-19 LAB — CULTURE, BLOOD (ROUTINE X 2)
CULTURE: NO GROWTH
Culture: NO GROWTH
Culture: NO GROWTH
Culture: NO GROWTH

## 2015-09-23 ENCOUNTER — Ambulatory Visit: Payer: Medicare Other | Admitting: Cardiovascular Disease

## 2015-10-07 NOTE — Progress Notes (Signed)
   09/14/2015 0400  Clinical Encounter Type  Visited With Family  Visit Type Death  Referral From Nurse  Consult/Referral To Chaplain  Spiritual Encounters  Spiritual Needs Emotional;Prayer;Grief support  Stress Factors  Family Stress Factors Loss  Chaplain provided pastoral care to family and a compassionate presence during their time of loss.   [employer] Adeana Grilliot [phone]

## 2015-10-07 NOTE — Discharge Summary (Signed)
[facility] [place] [facility]   PATIENT NAME: [name]    MR#:  [id number]  DATE OF BIRTH:  09-11-1931  DATE OF ADMISSION:  09/20/2015 ADMITTING PHYSICIAN: Shaune Pollack, MD  DATE OF expiration: Oct 06, 2015, 0225.  PRIMARY CARE PHYSICIAN: Tillman Abide, MD    ADMISSION DIAGNOSIS:  UTI (lower urinary tract infection) [N39.0] Hypoxia [R09.02] Aspiration pneumonia of right upper lobe, unspecified aspiration pneumonia type (HCC) [J69.0]   DISCHARGE DIAGNOSIS:  Sepsis with Right side PNA (HAP). Acute respiratory failure with Hypoxia.  ESBL UTI ARF Elevated troponin, due to demanding ischemia. Lactic acidosis SECONDARY DIAGNOSIS:   Past Medical History  Diagnosis Date  . Critical aortic valve stenosis     a. Relatively asymptomatic until 12/2013 - not a candidate for TAVR 2/2 dementia.  Marland Kitchen CAD (coronary artery disease)     a. s/p CABG 03/1997 at [facility] -  including LIMA to LAD, LRA to OM, SVG to RCA with open vein harvest left thigh.  . HTN (hypertension)   . Hyperlipidemia   . SVT (supraventricular tachycardia) (HCC)     a. s/p ablation [facility].  . Polio     childhood  . Urinary incontinence   . Fecal impaction (HCC) 12/24/2013  . History of UTI 12/24/2013    >100,000 col/mL Hafnia alvei  . Heart murmur   . History of blood transfusion 1998    "pretty sure she had to have blood when she had bypass surgery  . DJD (degenerative joint disease)   . Osteoarthritis   . Arthritis     "pretty bad"  . Depression     "related to son on combat duty in [place]"   . Moderate dementia     "at times it's pretty bad" (12/31/2013)  . Ischemic cardiomyopathy     a. 12/2013 Echo: EF 20-25%, diff HK, mild AI/Sev AS, mild MR, mod TR.  Marland Kitchen Chronic systolic CHF (congestive heart failure) (HCC)     a. 12/2013 Echo: EF 20-25%    HOSPITAL COURSE:   Sepsis with Right side PNA (HAP). The patient was treated with vancomycin, Zosyn and Levaquin. She was started with meropenem  due to ESBL infection.  Acute respiratory failure with Hypoxia.  She was treated with oxygen by nasal cannula. Nebulizer when necessary. Her condition worsened.  UTI. Started meropenem due to urine culture (ESBL).  ARF. She was treated with gentle rehydration,since worsening acute respiratory failure, IV fluid was discontinued.She was treated with 1 dose of Lasix. However, renal function worsened.Dr. Wynelle Link, nephrologist, suggested IV bicarbonate.  Hyperkalemia. Improved after Kayexalate treatment. Hypernatremia. Treated with D5 in bicarb gtt per Dr.Kolluru.  Elevated troponin, due to demanding ischemia. she was treated with aspirin, Lipitor. No cardiac workup per cardiology consult. Comfort care per Dr. Kirke Corin.  Lactic acidosis. Worsened due to above mentioned medical problems.  Chronic systolic CHF 20-25%.  Lasix was hold due to renal failure and low pressure.  CAD. she was treated with aspirin and Lipitor. No cardiac workup per cardiology consult.  Toprol was hold due to low blood pressure.  Aortic stenosis  The patient choked while eating per nurse. She was keeped nothing by mouth.  She was seen critical condition and had very poor prognosis.   I discussed with Dr. Wynelle Link and Dr. Dema Severin. Both Dr. Kirke Corin and Dr. Dema Severin suggested comfort care.  I discussed her 2 sons about the patient's critical condition, very poor prognosis and possible comfort care. They wanted to wait for their father to  decide.  The patient was having difficulty breathing with respirations 6-10 around 0205. Heart rate decreased. Patient expired around 0225.   DISCHARGE CONDITIONS:   Expired.  CONSULTS OBTAINED:  Treatment Team:  Lamont Dowdy, MD Stephanie Acre, MD    Shaune Pollack M.D on 09/25/2015 at 4:37 PM  Between 7am to 6pm - Pager - [phone]  After 6pm go to [email or web address] - password [email or web address]  [facility]  Office  [phone]  CC: Primary care physician;  Tillman Abide, MD

## 2015-10-07 NOTE — Progress Notes (Signed)
Got notified by respiratory that pt was having difficulty breathing around 0205, went to room and pt's respirations were 6-10, HR decreasing as well. Notified MD, Anne Hahn and Family. Remained with pt in room until she expired. Pt expired around 0225, witnessed by Nehemiah Settle, Charity fundraiser. Family came to see pt, hospital chaplin  And supervisor were notified as well.

## 2015-10-07 DEATH — deceased
